# Patient Record
Sex: Female | Born: 1985 | Race: White | Hispanic: No | Smoking: Former smoker
Health system: Southern US, Community
[De-identification: ages and names within clinical notes are randomized; demographics above are authoritative.]

## PROBLEM LIST (undated history)

## (undated) DIAGNOSIS — I4891 Unspecified atrial fibrillation: Secondary | ICD-10-CM

## (undated) DIAGNOSIS — R51 Headache: Secondary | ICD-10-CM

## (undated) DIAGNOSIS — J45909 Unspecified asthma, uncomplicated: Secondary | ICD-10-CM

## (undated) DIAGNOSIS — F419 Anxiety disorder, unspecified: Secondary | ICD-10-CM

## (undated) DIAGNOSIS — M1990 Unspecified osteoarthritis, unspecified site: Secondary | ICD-10-CM

## (undated) DIAGNOSIS — K219 Gastro-esophageal reflux disease without esophagitis: Secondary | ICD-10-CM

## (undated) DIAGNOSIS — T7840XA Allergy, unspecified, initial encounter: Secondary | ICD-10-CM

## (undated) DIAGNOSIS — R519 Headache, unspecified: Secondary | ICD-10-CM

## (undated) DIAGNOSIS — E282 Polycystic ovarian syndrome: Secondary | ICD-10-CM

## (undated) HISTORY — DX: Allergy, unspecified, initial encounter: T78.40XA

## (undated) HISTORY — DX: Unspecified atrial fibrillation: I48.91

## (undated) HISTORY — PX: TONSILLECTOMY: SUR1361

## (undated) HISTORY — PX: OTHER SURGICAL HISTORY: SHX169

## (undated) HISTORY — PX: KNEE SURGERY: SHX244

## (undated) HISTORY — PX: TUBAL LIGATION: SHX77

## (undated) HISTORY — PX: ABDOMINAL HYSTERECTOMY: SHX81

---

## 2014-01-22 ENCOUNTER — Emergency Department (HOSPITAL_COMMUNITY): Payer: Medicaid Other

## 2014-01-22 ENCOUNTER — Emergency Department (HOSPITAL_COMMUNITY)
Admission: EM | Admit: 2014-01-22 | Discharge: 2014-01-23 | Disposition: A | Discharge status: Home / Self Care | Payer: Medicaid Other | Attending: Emergency Medicine | Admitting: Emergency Medicine

## 2014-01-22 ENCOUNTER — Encounter (HOSPITAL_COMMUNITY): Payer: Self-pay | Admitting: Emergency Medicine

## 2014-01-22 DIAGNOSIS — N83209 Unspecified ovarian cyst, unspecified side: Secondary | ICD-10-CM

## 2014-01-22 DIAGNOSIS — F17200 Nicotine dependence, unspecified, uncomplicated: Secondary | ICD-10-CM | POA: Insufficient documentation

## 2014-01-22 DIAGNOSIS — N12 Tubulo-interstitial nephritis, not specified as acute or chronic: Secondary | ICD-10-CM

## 2014-01-22 DIAGNOSIS — Z3202 Encounter for pregnancy test, result negative: Secondary | ICD-10-CM | POA: Insufficient documentation

## 2014-01-22 HISTORY — DX: Polycystic ovarian syndrome: E28.2

## 2014-01-22 LAB — COMPREHENSIVE METABOLIC PANEL
ALT: 16 U/L (ref 0–35)
AST: 22 U/L (ref 0–37)
Albumin: 4.4 g/dL (ref 3.5–5.2)
Alkaline Phosphatase: 45 U/L (ref 39–117)
BILIRUBIN TOTAL: 0.4 mg/dL (ref 0.3–1.2)
BUN: 7 mg/dL (ref 6–23)
CHLORIDE: 101 meq/L (ref 96–112)
CO2: 23 meq/L (ref 19–32)
Calcium: 10.2 mg/dL (ref 8.4–10.5)
Creatinine, Ser: 0.63 mg/dL (ref 0.50–1.10)
GFR calc Af Amer: >90 mL/min (ref >90)
GFR calc non Af Amer: >90 mL/min (ref >90)
Glucose, Bld: 75 mg/dL (ref 70–99)
POTASSIUM: 3.8 meq/L (ref 3.7–5.3)
SODIUM: 140 meq/L (ref 137–147)
Total Protein: 7.4 g/dL (ref 6.0–8.3)

## 2014-01-22 LAB — URINALYSIS, ROUTINE W REFLEX MICROSCOPIC
GLUCOSE, UA: NEGATIVE mg/dL
KETONES UR: 40 mg/dL — AB
Nitrite: NEGATIVE
PH: 5.5 (ref 5.0–8.0)
Protein, ur: 100 mg/dL — AB
Specific Gravity, Urine: 1.022 (ref 1.005–1.030)
Urobilinogen, UA: 1 mg/dL (ref 0.0–1.0)

## 2014-01-22 LAB — URINE MICROSCOPIC-ADD ON

## 2014-01-22 LAB — CBC WITH DIFFERENTIAL/PLATELET
HCT: 41.4 % (ref 36.0–46.0)
Hemoglobin: 14.3 g/dL (ref 12.0–15.0)
MCH: 31.6 pg (ref 26.0–34.0)
MCHC: 34.5 g/dL (ref 30.0–36.0)
MCV: 91.6 fL (ref 78.0–100.0)
PLATELETS: 186 10*3/uL (ref 150–400)
RBC: 4.52 MIL/uL (ref 3.87–5.11)
RDW: 12.8 % (ref 11.5–15.5)
WBC: 14.8 10*3/uL — ABNORMAL HIGH (ref 4.0–10.5)

## 2014-01-22 LAB — POC URINE PREG, ED: Preg Test, Ur: NEGATIVE

## 2014-01-22 LAB — LIPASE, BLOOD: Lipase: 30 U/L (ref 11–59)

## 2014-01-22 MED ORDER — IOHEXOL 300 MG/ML  SOLN
25.0000 mL | Freq: Once | INTRAMUSCULAR | Status: AC | PRN
  Administered 2014-01-22: 25 mL via ORAL

## 2014-01-22 MED ORDER — DEXTROSE 5 % IV SOLN
1.0000 g | Freq: Once | INTRAVENOUS | Status: AC
  Administered 2014-01-22: 1 g via INTRAVENOUS
  Filled 2014-01-22: qty 10

## 2014-01-22 MED ORDER — IOHEXOL 300 MG/ML  SOLN
100.0000 mL | Freq: Once | INTRAMUSCULAR | Status: AC | PRN
  Administered 2014-01-22: 100 mL via INTRAVENOUS

## 2014-01-22 MED ORDER — SODIUM CHLORIDE 0.9 % IV BOLUS (SEPSIS)
1000.0000 mL | Freq: Once | INTRAVENOUS | Status: AC
  Administered 2014-01-22: 1000 mL via INTRAVENOUS

## 2014-01-22 MED ORDER — ONDANSETRON HCL 4 MG/2ML IJ SOLN
4.0000 mg | Freq: Once | INTRAMUSCULAR | Status: AC
  Administered 2014-01-22: 4 mg via INTRAVENOUS
  Filled 2014-01-22: qty 2

## 2014-01-22 MED ORDER — MORPHINE SULFATE 4 MG/ML IJ SOLN
4.0000 mg | Freq: Once | INTRAMUSCULAR | Status: AC
  Administered 2014-01-22: 4 mg via INTRAVENOUS
  Filled 2014-01-22: qty 1

## 2014-01-22 NOTE — ED Provider Notes (Signed)
CSN: [id number]     Arrival date & time 01/22/14  1801 History   First MD Initiated Contact with Patient 01/22/14 2003     Chief Complaint  Patient presents with  . Abdominal Pain     (Consider location/radiation/quality/duration/timing/severity/associated sxs/prior Treatment) The history is provided by the patient. No language interpreter was used.  Katrina Jones is a 28 year old female with past history pursuance presenting to the ED with abdominal pain that has been ongoing since January 2015. Stated that the abdominal pain is localized to the lower abdomen, bilateral sides described as a sharp pain that is constant. Stated the pain stays in the abdomen but at times radiates up to the area described in mild chest pain with increased discomfort of the abdomen. Reported that she has been unable to keep anything down secondary to eating make the pain worse. Reported that she's been feeling nauseous and vomiting. Stated that she was [facility] and [facility] back in January 2015 regarding similar issue with the identified a lytic lesion in her abdomen. Reported that she's been having intermittent diarrhea. Denied dizziness, melena, hematochezia, fever, chills, hematuria, chest pain, shortness of breath, difficulty breathing. PCP none  Past Medical History  Diagnosis Date  . Polycystic ovarian syndrome    Past Surgical History  Procedure Laterality Date  . Knee surgery     History reviewed. No pertinent family history. History  Substance Use Topics  . Smoking status: Current Some Day Smoker -- 0.50 packs/day    Types: Cigarettes  . Smokeless tobacco: Never Used  . Alcohol Use: Yes     Comment: occasionally   OB History   Grav Para Term Preterm Abortions TAB SAB Ect Mult Living                 Review of Systems  Constitutional: Negative for fever and chills.  Respiratory: Negative for chest tightness and shortness of breath.   Cardiovascular: Negative for chest  pain.  Gastrointestinal: Positive for abdominal pain. Negative for nausea, vomiting, constipation, blood in stool and anal bleeding.  Musculoskeletal: Negative for back pain and neck pain.  Neurological: Negative for dizziness, weakness and numbness.  All other systems reviewed and are negative.     Allergies  Lactose intolerance (gi)  Home Medications   Current Outpatient Rx  Name  Route  Sig  Dispense  Refill  . ibuprofen (ADVIL,MOTRIN) 200 MG tablet   Oral   Take 400 mg by mouth every 6 (six) hours as needed for moderate pain.         . Multiple Vitamin (MULTIVITAMIN WITH MINERALS) TABS tablet   Oral   Take 1 tablet by mouth daily.          BP 119/72  Pulse 72  Temp(Src) 98 F (36.7 C) (Oral)  Resp 18  SpO2 98%  LMP 01/22/2014 Physical Exam  Nursing note and vitals reviewed. Constitutional: She is oriented to person, place, and time. She appears well-developed and well-nourished. No distress.  HENT:  Head: Normocephalic and atraumatic.  Mouth/Throat: Oropharynx is clear and moist. No oropharyngeal exudate.  Eyes: Conjunctivae and EOM are normal. Pupils are equal, round, and reactive to light. Right eye exhibits no discharge. Left eye exhibits no discharge.  Neck: Normal range of motion. Neck supple. No tracheal deviation present.  Cardiovascular: Normal rate, regular rhythm and normal heart sounds.  Exam reveals no friction rub.   No murmur heard. Pulses:      Radial pulses are 2+  on the right side, and 2+ on the left side.       Dorsalis pedis pulses are 2+ on the right side, and 2+ on the left side.  Pulmonary/Chest: Effort normal and breath sounds normal. No respiratory distress. She has no wheezes. She has no rales.  Abdominal: Soft. Bowel sounds are normal. There is tenderness in the right lower quadrant, suprapubic area and left lower quadrant. There is guarding. There is no tenderness at McBurney's point and negative Murphy's sign.  Diffuse tenderness upon  palpation to the abdomen most discomfort upon palpation to the lower quadrants - right more so than the left  Negative abdominal distention noted Negative peritoneal signs  Genitourinary: Vagina normal. No vaginal discharge found.  Pelvic Exam: Negative swelling, erythema, inflammation, lesions, sores noted to the external genitalia. Negative swelling, erythema, inflammation, lesions, sores, ulcers noted to the vaginal canal. Bright red blood in the vaginal vault - patient currently menstruating. Negative friability of the cervix. Negative CMT and bilateral adnexal tenderness.   Musculoskeletal: Normal range of motion.  Full ROM to upper and lower extremities without difficulty noted, negative ataxia noted.  Lymphadenopathy:    She has no cervical adenopathy.  Neurological: She is alert and oriented to person, place, and time. No cranial nerve deficit. She exhibits normal muscle tone. Coordination normal.  Skin: Skin is warm and dry. She is not diaphoretic. No erythema.  Psychiatric: She has a normal mood and affect. Her behavior is normal. Thought content normal.    ED Course  Procedures (including critical care time)  Received imaging and results from patient's visit to [facility] - patient seen and assessed in the ED setting with CT abdomen and pelvis with contrast performed on 10/27/2013. CT abdomen and pelvis with contrast at this time identified a small amount of minimally complex fluid within the dependent pelvis in addition to by 1 cm right adnexal cystic lesion-this constellation of findings is nonspecific although may represent a physiological rupture of an ovarian cyst. Hiatal hernia identified.     Results for orders placed during the hospital encounter of 01/22/14  WET PREP, GENITAL      Result Value Ref Range   Yeast Wet Prep HPF POC NONE SEEN  NONE SEEN   Trich, Wet Prep NONE SEEN  NONE SEEN   Clue Cells Wet Prep HPF POC RARE (*) NONE SEEN   WBC, Wet Prep HPF  POC RARE (*) NONE SEEN  CBC WITH DIFFERENTIAL      Result Value Ref Range   WBC 14.8 (*) 4.0 - 10.5 K/uL   RBC 4.52  3.87 - 5.11 MIL/uL   Hemoglobin 14.3  12.0 - 15.0 g/dL   HCT 16.1  09.6 - 04.5 %   MCV 91.6  78.0 - 100.0 fL   MCH 31.6  26.0 - 34.0 pg   MCHC 34.5  30.0 - 36.0 g/dL   RDW 40.9  81.1 - 91.4 %   Platelets 186  150 - 400 K/uL  COMPREHENSIVE METABOLIC PANEL      Result Value Ref Range   Sodium 140  137 - 147 mEq/L   Potassium 3.8  3.7 - 5.3 mEq/L   Chloride 101  96 - 112 mEq/L   CO2 23  19 - 32 mEq/L   Glucose, Bld 75  70 - 99 mg/dL   BUN 7  6 - 23 mg/dL   Creatinine, Ser 7.82  0.50 - 1.10 mg/dL   Calcium 95.6  8.4 -  10.5 mg/dL   Total Protein 7.4  6.0 - 8.3 g/dL   Albumin 4.4  3.5 - 5.2 g/dL   AST 22  0 - 37 U/L   ALT 16  0 - 35 U/L   Alkaline Phosphatase 45  39 - 117 U/L   Total Bilirubin 0.4  0.3 - 1.2 mg/dL   GFR calc non Af Amer >90  >90 mL/min   GFR calc Af Amer >90  >90 mL/min  URINALYSIS, ROUTINE W REFLEX MICROSCOPIC      Result Value Ref Range   Color, Urine RED (*) YELLOW   APPearance TURBID (*) CLEAR   Specific Gravity, Urine 1.022  1.005 - 1.030   pH 5.5  5.0 - 8.0   Glucose, UA NEGATIVE  NEGATIVE mg/dL   Hgb urine dipstick LARGE (*) NEGATIVE   Bilirubin Urine MODERATE (*) NEGATIVE   Ketones, ur 40 (*) NEGATIVE mg/dL   Protein, ur 409100 (*) NEGATIVE mg/dL   Urobilinogen, UA 1.0  0.0 - 1.0 mg/dL   Nitrite NEGATIVE  NEGATIVE   Leukocytes, UA MODERATE (*) NEGATIVE  URINE MICROSCOPIC-ADD ON      Result Value Ref Range   Squamous Epithelial / LPF FEW (*) RARE   WBC, UA TOO NUMEROUS TO COUNT  <3 WBC/hpf   RBC / HPF TOO NUMEROUS TO COUNT  <3 RBC/hpf   Bacteria, UA FEW (*) RARE  LIPASE, BLOOD      Result Value Ref Range   Lipase 30  11 - 59 U/L  POC URINE PREG, ED      Result Value Ref Range   Preg Test, Ur NEGATIVE  NEGATIVE   Labs Review Labs Reviewed  WET PREP, GENITAL - Abnormal; Notable for the following:    Clue Cells Wet Prep HPF POC  RARE (*)    WBC, Wet Prep HPF POC RARE (*)    All other components within normal limits  CBC WITH DIFFERENTIAL - Abnormal; Notable for the following:    WBC 14.8 (*)    All other components within normal limits  URINALYSIS, ROUTINE W REFLEX MICROSCOPIC - Abnormal; Notable for the following:    Color, Urine RED (*)    APPearance TURBID (*)    Hgb urine dipstick LARGE (*)    Bilirubin Urine MODERATE (*)    Ketones, ur 40 (*)    Protein, ur 100 (*)    Leukocytes, UA MODERATE (*)    All other components within normal limits  URINE MICROSCOPIC-ADD ON - Abnormal; Notable for the following:    Squamous Epithelial / LPF FEW (*)    Bacteria, UA FEW (*)    All other components within normal limits  GC/CHLAMYDIA PROBE AMP  URINE CULTURE  COMPREHENSIVE METABOLIC PANEL  LIPASE, BLOOD  POC URINE PREG, ED   Imaging Review Ct Abdomen Pelvis W Contrast  01/22/2014   CLINICAL DATA:  Lower abdominal pain extending to the back. History of polycystic ovarian disease.  EXAM: CT ABDOMEN AND PELVIS WITH CONTRAST  TECHNIQUE: Multidetector CT imaging of the abdomen and pelvis was performed using the standard protocol following bolus administration of intravenous contrast.  CONTRAST:  25mL OMNIPAQUE IOHEXOL 300 MG/ML SOLN, 100mL OMNIPAQUE IOHEXOL 300 MG/ML SOLN  COMPARISON:  None.  FINDINGS: Lung bases are clear. No pleural or pericardial fluid. The liver has normal appearance without focal lesions or biliary ductal dilatation. No calcified gallstones. The spleen is normal. The pancreas is normal. The adrenal glands are normal. The kidneys are normal. The  aorta and IVC are normal. No retroperitoneal mass or adenopathy.  The colon seems to be focal of fluid. This includes the rectum which is dilated and fluid-filled. The uterus is normal. There is a cyst associated with the right ovary measuring 3.4 cm in diameter. The left ovary appears grossly normal. I do not specifically identify the appendix.  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 This could be associated with back pain.  IMPRESSION: There appears to be fluid throughout the colon. No specific colon pathology is identified however.  3.4 cm cyst associated with the right ovary. In a person of this age, this is consistent with a functional cyst.  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 This could be associated with low back pain.   Electronically Signed   By: Paulina FusiMark  Shogry M.D.   On: 01/22/2014 22:54     EKG Interpretation None      MDM   Final diagnoses:  Pyelonephritis  Ovarian cyst   Medications  sodium chloride 0.9 % bolus 1,000 mL (0 mLs Intravenous Stopped 01/22/14 2340)  cefTRIAXone (ROCEPHIN) 1 g in dextrose 5 % 50 mL IVPB (0 g Intravenous Stopped 01/22/14 2339)  ondansetron (ZOFRAN) injection 4 mg (4 mg Intravenous Given 01/22/14 2212)  iohexol (OMNIPAQUE) 300 MG/ML solution 25 mL (25 mLs Oral Contrast Given 01/22/14 2151)  morphine 4 MG/ML injection 4 mg (4 mg Intravenous Given 01/22/14 2215)  iohexol (OMNIPAQUE) 300 MG/ML solution 100 mL (100 mLs Intravenous Contrast Given 01/22/14 2224)  morphine 4 MG/ML injection 4 mg (4 mg Intravenous Given 01/23/14 0005)  ondansetron (ZOFRAN) injection 4 mg (4 mg Intravenous Given 01/23/14 0004)   Filed Vitals:   01/22/14 1815 01/22/14 2234  BP: 115/80 119/72  Pulse: 78 72  Temp: 99.6 F (37.6 C) 98 F (36.7 C)  TempSrc: Oral Oral  Resp: 16 18  SpO2: 97% 98%    Patient presenting to the ED with abdominal pain been ongoing since January 2015 localized to bilateral lower abdominal region described as a sharp radiation to the chest with episodes of intense pain. Reported that she's been unable to keep any food or fluids down since January secondary to every time she feels pain worsens and she feels nauseous with episodes of emesis. Reported intermittent episodes diarrhea. Patient reported that [facility] with  CT scan of the abdomen was performed with findings of lytic lesions. CT scan obtained from [facility] with negative findings performed in January 2015.  Alert and oriented. GCS 15. Heart rate and rhythm normal. Lungs clear to auscultation to upper and lower lobes bilaterally. Bowel sounds normoactive in all 4 quadrants of the most discomfort upon palpation to the bilateral lower quadrants and suprapubic region - right more so than the left. Soft upon palpation. Negative McBurney's point. Negative Murphy's sign. Negative peritoneal signs. CBC noted mild elevated white blood, 14.8. CMP negative findings-kidneys and liver functioning well. Urinalysis noted large hemoglobin-patient currently menstruating. A moderate amount of leukocytes with many white blood cells-too numerous to count identified. Urine pregnancy negative. Wet prep negative for clue cells and white blood cell count-negative Trichomonas or yeast noted. CT abdomen and pelvis with contrast identified fluid throughout the colon, no specific pathology is identified. 3.4 cm cyst associated with the right ovary person disease this appears to be a functional cyst. Urine culture pending.  This provider discussed US to rule out gallbladder disease - patient reported  that she does not want to stay to get the imaging. Discussed dangers of gallbladder disease and fatality - patient understood and continued to decline the exam. Discussed with Dr. Wilkie Aye case - labs reviewed and liver enzymes within normal limits, no sign of gallstones noted on CT scan, as per physician reported okay for home. Doubt appendicitis. Doubt cholecystitis/cholangitis. Doubt pancreatitis. Negative trichomoniasis-doubt TOA. Doubt ectopic pregnancy. Negative acute abdominal processes. Negative hydronephrosis. Patient started on IV antibiotics and IV fluids given for urine infection-pyelonephritis. Patient's CT abdomen and pelvis from January 2015 reviewed, appears the the cyst on the right ovary  has increased from 3.1 to 3.4 - appears to be a functional cyst. Discussed with patient the increased in size of the ovarian cyst. Patient tolerated fluid PO without difficulty. Patient stable, afebrile. Discharged patient. Discharged patient with antibiotics and pain medications. Discussed with patient to rest and stay hydrated. Discussed with patient and educated on signs and symptoms of gallbladder disease. Referred [facility]. Discussed with patient the importance of following up and possible biopsy of the ovarian cyst. Discussed with patient to closely monitor symptoms and if symptoms are to worsen or change to report back to the ED - strict return instructions given.  Patient agreed to plan of care, understood, all questions answered.   Raymon Mutton, PA-C 01/23/14 1245  [address] PA-C 01/23/14 1246  Raymon Mutton, PA-C 01/23/14 1249  Raymon Mutton, PA-C 01/23/14 1255  12:56 PM This provider spoke with the patient - verified by DOB and name. Patient reported that she is still in pain. Stated that she was able to tolerate fluids yesterday, but not able to tolerate anything PO today. Patient reported that most of her discomfort is in the RUQ. This provider recommended patient to get [place] to rule out gallbladder issue, as per request last night that patient declined due to her ride being here. Discussed importance and dangers - patient understood. Patient understood and reported that she will come back to the ED later on this afternoon.   Raymon Mutton, PA-C 01/23/14 1302

## 2014-01-22 NOTE — ED Notes (Signed)
Patient states she has lower abdominal pain that radiates into her lower back. Patient states she was diagnosed with an cyst in January 2015. Patient states that she has not been able to eat or drink for the past month.

## 2014-01-22 NOTE — ED Notes (Signed)
POCT PREG resulted NEG. 

## 2014-01-23 LAB — WET PREP, GENITAL
Trich, Wet Prep: NONE SEEN
YEAST WET PREP: NONE SEEN

## 2014-01-23 LAB — GC/CHLAMYDIA PROBE AMP
CT Probe RNA: NEGATIVE
GC Probe RNA: NEGATIVE

## 2014-01-23 LAB — URINE CULTURE
Colony Count: >=100000
Special Requests: NORMAL

## 2014-01-23 MED ORDER — ONDANSETRON HCL 4 MG/2ML IJ SOLN
4.0000 mg | Freq: Once | INTRAMUSCULAR | Status: AC
  Administered 2014-01-23: 4 mg via INTRAVENOUS
  Filled 2014-01-23: qty 2

## 2014-01-23 MED ORDER — CIPROFLOXACIN HCL 500 MG PO TABS: 500.0000 mg | ORAL_TABLET | Freq: Two times a day (BID) | ORAL | Status: DC

## 2014-01-23 MED ORDER — MORPHINE SULFATE 4 MG/ML IJ SOLN
4.0000 mg | Freq: Once | INTRAMUSCULAR | Status: AC
  Administered 2014-01-23: 4 mg via INTRAVENOUS
  Filled 2014-01-23: qty 1

## 2014-01-23 MED ORDER — NAPROXEN 375 MG PO TABS: 375.0000 mg | ORAL_TABLET | Freq: Two times a day (BID) | ORAL | Status: DC

## 2014-01-23 MED ORDER — ONDANSETRON HCL 4 MG PO TABS: 4.0000 mg | ORAL_TABLET | Freq: Four times a day (QID) | ORAL | Status: DC

## 2014-01-23 NOTE — Discharge Instructions (Signed)
Please call your doctor for a followup appointment within 24-48 hours. When you talk to your doctor please let them know that you were seen in the emergency department and have them acquire all of your records so that they can discuss the findings with you and formulate a treatment plan to fully care for your new and ongoing problems. Please call and set-up an appointment with health and wellness center Please call and set-up an appointment with OBGYN will most likely need a repeat ultrasound and biopsy to be performed on the right ovarian cyst Please rest and stay hydrated Please take antibiotics as prescribed Please continue to monitor symptoms and if symptoms are to worsen or change (fever greater than 101, chills, sweating, nausea, vomiting, diarrhea, abdominal pain, chest pain, shortness of breath, difficulty breathing, weakness, fatigue, inability to keep food or fluids down, back pain, neck pain, neck stiffness, syncope) please report back to the ED immediately    Pyelonephritis, Adult Pyelonephritis is a kidney infection. In general, there are 2 main types of pyelonephritis:  Infections that come on quickly without any warning (acute pyelonephritis).  Infections that persist for a long period of time (chronic pyelonephritis). CAUSES  Two main causes of pyelonephritis are:  Bacteria traveling from the bladder to the kidney. This is a problem especially in pregnant women. The urine in the bladder can become filled with bacteria from multiple causes, including:  Inflammation of the prostate gland (prostatitis).  Sexual intercourse in females.  Bladder infection (cystitis).  Bacteria traveling from the bloodstream to the tissue part of the kidney. Problems that may increase your risk of getting a kidney infection include:  Diabetes.  Kidney stones or bladder stones.  Cancer.  Catheters placed in the bladder.  Other abnormalities of the kidney or ureter. SYMPTOMS   Abdominal  pain.  Pain in the side or flank area.  Fever.  Chills.  Upset stomach.  Blood in the urine (dark urine).  Frequent urination.  Strong or persistent urge to urinate.  Burning or stinging when urinating. DIAGNOSIS  Your caregiver may diagnose your kidney infection based on your symptoms. A urine sample may also be taken. TREATMENT  In general, treatment depends on how severe the infection is.   If the infection is mild and caught early, your caregiver may treat you with oral antibiotics and send you home.  If the infection is more severe, the bacteria may have gotten into the bloodstream. This will require intravenous (IV) antibiotics and a hospital stay. Symptoms may include:  High fever.  Severe flank pain.  Shaking chills.  Even after a hospital stay, your caregiver may require you to be on oral antibiotics for a period of time.  Other treatments may be required depending upon the cause of the infection. HOME CARE INSTRUCTIONS   Take your antibiotics as directed. Finish them even if you start to feel better.  Make an appointment to have your urine checked to make sure the infection is gone.  Drink enough fluids to keep your urine clear or pale yellow.  Take medicines for the bladder if you have urgency and frequency of urination as directed by your caregiver. SEEK IMMEDIATE MEDICAL CARE IF:   You have a fever or persistent symptoms for more than 2-3 days.  You have a fever and your symptoms suddenly get worse.  You are unable to take your antibiotics or fluids.  You develop shaking chills.  You experience extreme weakness or fainting.  There is no improvement after  2 days of treatment. MAKE SURE YOU:  Understand these instructions.  Will watch your condition.  Will get help right away if you are not doing well or get worse. Document Released: 09/28/2005 Document Revised: 03/29/2012 Document Reviewed: 03/04/2011 [facility] Patient Information 2014  [place], [place].  Ovarian Cyst An ovarian cyst is a sac filled with fluid or blood. This sac is attached to the ovary. Some cysts go away on their own. Other cysts need treatment.  HOME CARE   Only take medicine as told by your doctor.  Follow up with your doctor as told.  Get regular pelvic exams and Pap tests. GET HELP IF:  Your periods are late, not regular, or painful.  You stop having periods.  Your belly (abdominal) or pelvic pain does not go away.  Your belly becomes large or puffy (swollen).  You have a hard time peeing (totally emptying your bladder).  You have pressure on your bladder.  You have pain during sex.  You feel fullness, pressure, or discomfort in your belly.  You lose weight for no reason.  You feel sick most of the time.  You have a hard time pooping (constipation).  You do not feel like eating.  You develop pimples (acne).  You have an increase in hair on your body and face.  You are gaining weight for no reason.  You think you are pregnant. GET HELP RIGHT AWAY IF:   Your belly pain gets worse.  You feel sick to your stomach (nauseous), and you throw up (vomit).  You have a fever that comes on fast.  You have belly pain while pooping (bowel movement).  Your periods are heavier than usual. MAKE SURE YOU:   Understand these instructions.  Will watch your condition.  Will get help right away if you are not doing well or get worse. Document Released: 03/16/2008 Document Revised: 07/19/2013 Document Reviewed: 06/05/2013 [facility] Patient Information 2014 [place], [place].   Emergency Department Resource Guide 1) Find a Doctor and Pay Out of Pocket Although you won't have to find out who is covered by your insurance plan, it is a good idea to ask around and get recommendations. You will then need to call the office and see if the doctor you have chosen will accept you as a new patient and what types of options they offer for patients  who are self-pay. Some doctors offer discounts or will set up payment plans for their patients who do not have insurance, but you will need to ask so you aren't surprised when you get to your appointment.  2) Contact Your Local Health Department Not all health departments have doctors that can see patients for sick visits, but many do, so it is worth a call to see if yours does. If you don't know where your local health department is, you can check in your phone book. The CDC also has a tool to help you locate your state's health department, and many state websites also have listings of all of their local health departments.  3) Find a Walk-in Clinic If your illness is not likely to be very severe or complicated, you may want to try a walk in clinic. These are popping up all over the country in pharmacies, drugstores, and shopping centers. They're usually staffed by nurse practitioners or physician assistants that have been trained to treat common illnesses and complaints. They're usually fairly quick and inexpensive. However, if you have serious medical issues or chronic medical problems, these are  probably not your best option.  No Primary Care Doctor: - Call Health Connect at  [phone] - they can help you locate a primary care doctor that  accepts your insurance, provides certain services, etc. - Physician Referral Service- [phone]  Chronic Pain Problems: Organization         Address  Phone   Notes  [facility]  [phone] Patients need to be referred by their primary care doctor.   Medication Assistance: Organization         Address  Phone   Notes  [facility]ication [facility] [facility] [address] [place], [place] [address] [phone]) [phone] --Must be a resident of [facility] [place] -- Must have NO insurance coverage whatsoever (no Medicaid/ Medicare, etc.) -- The pt. MUST have a primary care doctor that directs their care regularly and follows  them in the community   MedAssist  [phone]   [employer]  [phone]    Agencies that provide inexpensive medical care: Organization         Address  Phone   Notes  [facility]  [phone]   [facility]    [phone]   [facility] [address] [place], [place] [address] [phone]   [facility] [address], [place] [phone]) [phone]   [facility]anned Parenthood    [phone]) [phone]   [facility]    [phone]   [facility]  [address], [place] Phone:  [phone], Fax:  [phone] Hours of Operation:  9 am - 6 pm, M-F.  Also accepts Medicaid/Medicare and self-pay.  [facility]  [address], [place] Phone: [phone], Fax: [phone]. Hours of Operation:  8:30 am - 5:30 pm, M-F.  Also accepts Medicaid and self-pay.  [facility] [place] [address], [place] Phone: [phone]) [phone]   [facility] [address], [place], [place] [phone], Ext. [phone] Mondays & Thursdays: 7-9 AM.  First 15 patients are seen on a first come, first serve basis.    Medicaid-accepting [facility] Providers:  Organization         Address  Phone   Notes  [facility] [address], [place] [phone] Also accepts self-pay patients.  [facility] [address] [place], [place]  [phone]   [facility] [address], [place] [phone]   [facility] [address] [place] [address], [place] [phone]   Renaye RakersVeita Bland [address], [place]   [phone] Only accepts [place] Access [place] patients after they have their name applied to their card.   Self-Pay (no insurance) in [facility] [place]:  Organization         Address  Phone   Notes  Sickle Cell  Patients, [facility] [address], [place] [phone]   [facility] [address], [place] [phone]   [facility] [place]  [address], [place] [phone]   [facility]/Dr. Osei-Bonsu  [address] [place] [address], [place] or [phone], [place] [phone] Phone number for both [place] and [place] locations is the same.  [facility]  [address] [place] [phone]   [facility] [address], [place] or [address] [phone]   [facility] [address], [place] [phone], phone; [phone], fax Sees patients 1st and 3rd Saturday of every month.  Must not qualify for public or private insurance (i.e. Medicaid, Medicare, [place] Health Choice, Veterans' Benefits)  Household income should be no more than 200% of the poverty level The clinic cannot treat you if you are pregnant or think you are pregnant  Sexually transmitted diseases are not treated at the clinic.    Dental Care: Organization         Address  Phone  Notes  [facility] Department of [facility] [address] [place], [place] [phone] Accepts children up to age 67 who are enrolled in [place] or [place] Health Choice; pregnant women with a Medicaid card; and children who have applied for Medicaid or [place] Health Choice, but were declined, whose parents can pay a reduced fee at time of service.  [facility] Department of [facility]  [address], [place] [phone] Accepts children up to age 35 who are enrolled in [place] or [place] Health Choice; pregnant women with a Medicaid card; and children who have applied for Medicaid or  Health Choice, but were declined, whose parents can pay a reduced fee at time of service.  [facility]  [address] [place], [place] [phone] Patients are seen by appointment only. Walk-ins are not accepted. [facility] will see patients 25 years of age and older. Monday - Tuesday (8am-5pm) Most Wednesdays (8:30-5pm) $30 per visit, cash only  [facility] Adult [facility] Access PRO[facility]  [address], [facility] [phone] Patients are seen by appointment only. Walk-ins are not accepted. [facility] will see patients 74 years of age and older. One Wednesday Evening (Monthly: Volunteer Based).  $30 per visit, cash only  [employer]  [phone] for adults; Children under age 79, call Graduate Pediatric Dentistry at [phone]. Children aged 21-14, please call [phone] to request a pediatric application.  Dental services are provided in all areas of dental care including fillings, crowns and bridges, complete and partial dentures, implants, gum treatment, root canals, and extractions. Preventive care is also provided. Treatment is provided to both adults and children. Patients are selected via a lottery and there is often a waiting list.   [facility] [address], [place]  [phone] [email or web address]   [facility] [address] [place], [place] [phone], Ext. [phone] Second and Fourth Thursday of each month, opens at 6:30 AM; Clinic ends at 9 AM.  Patients are seen on a first-come first-served basis, and a limited number are seen during each clinic.   [facility]  [address] [place], [place] [phone]   Eligibility Requirements You must have lived in [place], [place], or [place] counties for at least the last three months.   You cannot be eligible for state or federal sponsored [place], including [employer], [place], or [employer].   You generally cannot be eligible for healthcare insurance through your employer.    How to apply: Eligibility  screenings are held every Tuesday and Wednesday afternoon from 1:00 pm until 4:00 pm. You do not need an appointment for the interview!  [facility]  [address] [place], [place] [phone]   [facility] Health Department  [phone]   [facility] Health Department  [phone]   [facility] Health Department  [phone]    Behavioral Health Resources in the Community: Intensive Outpatient Programs Organization         Address  Phone  Notes  [facility] [address] [place], [place] [phone]   [facility] Outpatient [address] [place], [place] [phone]   [facility] [address] [place], [place]  [phone]   [facility] Mental Health [address]  [place], [place] [phone] or [phone]   Substance Abuse Resources Organization         Address  Phone  Notes  Alcohol [facility] Drug Services  [phone]   [facility]  [phone]   [facility] [place]  [phone]   Floydene Flock  [phone]   Residential & Outpatient Substance Abuse Program  [phone]   Psychological Services Organization         Address  Phone  Notes  [facility]  [phone]   [facility]  [phone]   [facility] [address] [place] [phone] or [phone]    Mobile Crisis Teams Organization         Address  Phone  Notes  [facility], Mobile Crisis Care Unit  [phone]   [facility]  [address] [place], [place] [phone]   Doristine Locks [address] [place] [phone]    Self-Help/Support Groups Organization         Address  Phone             Notes  [facility] [place] - variety of support groups  [phone]- I7437963 Call for more information  Narcotics Anonymous (NA), [facility] [address], [employer] [place]  2 meetings at  this location   Statistician         Address  Phone  Notes  [facility] [address],    [place]  [phone]   [facility]  [address], [place] [address], [place], [place] [phone]   [facility] Treatment [facility] [address] [place], [place] [phone] Admissions: 8am-3pm M-F  [facility] [address],    [place], [place] [phone]   [facility] [address] [place], [place], [place] [phone]   [facility] [address],  [place], [place] [phone]   [facility] - Intensive Outpatient [address], [place], [place] [phone]   [facility] [address]  [place], [place] [phone] or [phone]   [facility] ([facility]) [address], [place], [place] [phone] Accepts Medicaid  [facility] [address],  [place] [phone] Substance Abuse/Addiction Treatment   [facility] Organization         Address  Phone  Notes  [facility]  [phone]   Angie Fava, PhD [address] [place], [place]   7402989032 or [phone]   [facility]al   [address] [place], [place] [phone]   [facility] [address] [place], [place] [phone] Insurance/Medicaid/sponsorship through [employer] and Families [address]  [place], [place] [phone] [place], [place] [phone]    Dr. Adele Schilder  [phone]   [facility] Dept. 1) [address] [place] 2) [place] 3)  [place] [address], [place] [phone]  [phone]   [place] [phone] or [phone] (After Hours)

## 2014-01-23 NOTE — ED Provider Notes (Signed)
Medical screening examination/treatment/procedure(s) were performed by non-physician practitioner and as supervising physician I was immediately available for consultation/collaboration.   EKG Interpretation None        Gwyneth SproutWhitney Tymar Polyak, MD 01/23/14 2358

## 2014-01-25 ENCOUNTER — Encounter (HOSPITAL_COMMUNITY): Payer: Self-pay | Admitting: Emergency Medicine

## 2014-01-25 ENCOUNTER — Emergency Department (HOSPITAL_COMMUNITY)
Admission: EM | Admit: 2014-01-25 | Discharge: 2014-01-25 | Disposition: A | Discharge status: Home / Self Care | Payer: Medicaid Other | Attending: Emergency Medicine | Admitting: Emergency Medicine

## 2014-01-25 DIAGNOSIS — Z8742 Personal history of other diseases of the female genital tract: Secondary | ICD-10-CM | POA: Insufficient documentation

## 2014-01-25 DIAGNOSIS — R1011 Right upper quadrant pain: Secondary | ICD-10-CM | POA: Insufficient documentation

## 2014-01-25 DIAGNOSIS — R509 Fever, unspecified: Secondary | ICD-10-CM | POA: Insufficient documentation

## 2014-01-25 DIAGNOSIS — R112 Nausea with vomiting, unspecified: Secondary | ICD-10-CM | POA: Insufficient documentation

## 2014-01-25 DIAGNOSIS — F17200 Nicotine dependence, unspecified, uncomplicated: Secondary | ICD-10-CM | POA: Insufficient documentation

## 2014-01-25 DIAGNOSIS — Z87440 Personal history of urinary (tract) infections: Secondary | ICD-10-CM | POA: Insufficient documentation

## 2014-01-25 DIAGNOSIS — Z792 Long term (current) use of antibiotics: Secondary | ICD-10-CM | POA: Insufficient documentation

## 2014-01-25 DIAGNOSIS — Z791 Long term (current) use of non-steroidal anti-inflammatories (NSAID): Secondary | ICD-10-CM | POA: Insufficient documentation

## 2014-01-25 DIAGNOSIS — R638 Other symptoms and signs concerning food and fluid intake: Secondary | ICD-10-CM | POA: Insufficient documentation

## 2014-01-25 LAB — CBC WITH DIFFERENTIAL/PLATELET
Basophils Absolute: 0 10*3/uL (ref 0.0–0.1)
Basophils Relative: 0 % (ref 0–1)
EOS PCT: 2 % (ref 0–5)
Eosinophils Absolute: 0.2 10*3/uL (ref 0.0–0.7)
HEMATOCRIT: 44.9 % (ref 36.0–46.0)
HEMOGLOBIN: 15.2 g/dL — AB (ref 12.0–15.0)
LYMPHS ABS: 1.7 10*3/uL (ref 0.7–4.0)
LYMPHS PCT: 24 % (ref 12–46)
MCH: 31.3 pg (ref 26.0–34.0)
MCHC: 33.9 g/dL (ref 30.0–36.0)
MCV: 92.6 fL (ref 78.0–100.0)
MONO ABS: 0.5 10*3/uL (ref 0.1–1.0)
MONOS PCT: 7 % (ref 3–12)
NEUTROS ABS: 4.6 10*3/uL (ref 1.7–7.7)
Neutrophils Relative %: 66 % (ref 43–77)
Platelets: 194 10*3/uL (ref 150–400)
RBC: 4.85 MIL/uL (ref 3.87–5.11)
RDW: 13.1 % (ref 11.5–15.5)
WBC: 7.1 10*3/uL (ref 4.0–10.5)

## 2014-01-25 LAB — COMPREHENSIVE METABOLIC PANEL
ALT: 12 U/L (ref 0–35)
AST: 17 U/L (ref 0–37)
Albumin: 4.1 g/dL (ref 3.5–5.2)
Alkaline Phosphatase: 40 U/L (ref 39–117)
BUN: 6 mg/dL (ref 6–23)
CALCIUM: 9.2 mg/dL (ref 8.4–10.5)
CO2: 26 mEq/L (ref 19–32)
Chloride: 107 mEq/L (ref 96–112)
Creatinine, Ser: 0.7 mg/dL (ref 0.50–1.10)
GFR calc non Af Amer: >90 mL/min (ref >90)
GLUCOSE: 91 mg/dL (ref 70–99)
POTASSIUM: 4.4 meq/L (ref 3.7–5.3)
Sodium: 142 mEq/L (ref 137–147)
TOTAL PROTEIN: 6.3 g/dL (ref 6.0–8.3)
Total Bilirubin: <0.2 mg/dL — ABNORMAL LOW (ref 0.3–1.2)

## 2014-01-25 LAB — LIPASE, BLOOD: Lipase: 75 U/L — ABNORMAL HIGH (ref 11–59)

## 2014-01-25 MED ORDER — FENTANYL CITRATE 0.05 MG/ML IJ SOLN
50.0000 ug | Freq: Once | INTRAMUSCULAR | Status: AC
  Administered 2014-01-25: 50 ug via INTRAVENOUS
  Filled 2014-01-25: qty 2

## 2014-01-25 MED ORDER — SODIUM CHLORIDE 0.9 % IV BOLUS (SEPSIS)
1000.0000 mL | Freq: Once | INTRAVENOUS | Status: AC
  Administered 2014-01-25: 1000 mL via INTRAVENOUS

## 2014-01-25 MED ORDER — ONDANSETRON 4 MG PO TBDP: ORAL_TABLET | ORAL | Status: DC

## 2014-01-25 MED ORDER — DEXTROSE 5 % IV SOLN
1.0000 g | Freq: Once | INTRAVENOUS | Status: DC
  Filled 2014-01-25: qty 10

## 2014-01-25 NOTE — Discharge Instructions (Signed)
If you were given medicines take as directed.  If you are on coumadin or contraceptives realize their levels and effectiveness is altered by many different medicines.  If you have any reaction (rash, tongues swelling, other) to the medicines stop taking and see a physician.   Please follow up as directed and return to the ER or see a physician for new or worsening symptoms.  Thank you. Filed Vitals:   01/25/14 0902 01/25/14 1059  BP: 127/64 103/61  Pulse: 74 84  Temp: 98 F (36.7 C)   TempSrc: Oral   Resp: 20 18  SpO2: 100% 97%    Biliary Colic  Biliary colic is a steady or irregular pain in the upper abdomen. It is usually under the right side of the rib cage. It happens when gallstones interfere with the normal flow of bile from the gallbladder. Bile is a liquid that helps to digest fats. Bile is made in the liver and stored in the gallbladder. When you eat a meal, bile passes from the gallbladder through the cystic duct and the common bile duct into the small intestine. There, it mixes with partially digested food. If a gallstone blocks either of these ducts, the normal flow of bile is blocked. The muscle cells in the bile duct contract forcefully to try to move the stone. This causes the pain of biliary colic.  SYMPTOMS   A person with biliary colic usually complains of pain in the upper abdomen. This pain can be:  In the center of the upper abdomen just below the breastbone.  In the upper-right part of the abdomen, near the gallbladder and liver.  Spread back toward the right shoulder blade.  Nausea and vomiting.  The pain usually occurs after eating.  Biliary colic is usually triggered by the digestive system's demand for bile. The demand for bile is high after fatty meals. Symptoms can also occur when a person who has been fasting suddenly eats a very large meal. Most episodes of biliary colic pass after 1 to 5 hours. After the most intense pain passes, your abdomen may continue  to ache mildly for about 24 hours. DIAGNOSIS  After you describe your symptoms, your caregiver will perform a physical exam. He or she will pay attention to the upper right portion of your belly (abdomen). This is the area of your liver and gallbladder. An ultrasound will help your caregiver look for gallstones. Specialized scans of the gallbladder may also be done. Blood tests may be done, especially if you have fever or if your pain persists. PREVENTION  Biliary colic can be prevented by controlling the risk factors for gallstones. Some of these risk factors, such as heredity, increasing age, and pregnancy are a normal part of life. Obesity and a high-fat diet are risk factors you can change through a healthy lifestyle. Women going through menopause who take hormone replacement therapy (estrogen) are also more likely to develop biliary colic. TREATMENT   Pain medication may be prescribed.  You may be encouraged to eat a fat-free diet.  If the first episode of biliary colic is severe, or episodes of colic keep retuning, surgery to remove the gallbladder (cholecystectomy) is usually recommended. This procedure can be done through small incisions using an instrument called a laparoscope. The procedure often requires a brief stay in the hospital. Some people can leave the hospital the same day. It is the most widely used treatment in people troubled by painful gallstones. It is effective and safe, with no  complications in more than 90% of cases.  If surgery cannot be done, medication that dissolves gallstones may be used. This medication is expensive and can take months or years to work. Only small stones will dissolve.  Rarely, medication to dissolve gallstones is combined with a procedure called shock-wave lithotripsy. This procedure uses carefully aimed shock waves to break up gallstones. In many people treated with this procedure, gallstones form again within a few years. PROGNOSIS  If gallstones  block your cystic duct or common bile duct, you are at risk for repeated episodes of biliary colic. There is also a 25% chance that you will develop a gallbladder infection(acute cholecystitis), or some other complication of gallstones within 10 to 20 years. If you have surgery, schedule it at a time that is convenient for you and at a time when you are not sick. HOME CARE INSTRUCTIONS   Drink plenty of clear fluids.  Avoid fatty, greasy or fried foods, or any foods that make your pain worse.  Take medications as directed. SEEK MEDICAL CARE IF:   You develop a fever over 100.5 F (38.1 C).  Your pain gets worse over time.  You develop nausea that prevents you from eating and drinking.  You develop vomiting. SEEK IMMEDIATE MEDICAL CARE IF:   You have continuous or severe belly (abdominal) pain which is not relieved with medications.  You develop nausea and vomiting which is not relieved with medications.  You have symptoms of biliary colic and you suddenly develop a fever and shaking chills. This may signal cholecystitis. Call your caregiver immediately.  You develop a yellow color to your skin or the white part of your eyes (jaundice). Document Released: 03/01/2006 Document Revised: 12/21/2011 Document Reviewed: 05/10/2008 [facility] Patient Information 2014 [employer], [place].

## 2014-01-25 NOTE — ED Notes (Addendum)
Pt c/o right side abd pain. Pt states its been going on since Jan. 15. Was seen two days ago but couldn't stay and get ultrasound. Wants ultraound today. Pt in NAD on cell phone in triage.

## 2014-01-25 NOTE — ED Notes (Addendum)
Patient states she needs to leave ED, left prior to providing urine specimen and prior to receiving rocephin. Patient spoke with doctor Jodi MourningZavitz about not receiving rocephin, Jodi MourningZavitz, MD proceeded to discharge patient.

## 2014-01-25 NOTE — ED Provider Notes (Addendum)
CSN: [id number]     Arrival date & time 01/25/14  [id number] History   First MD Initiated Contact with Patient 01/25/14 0913     Chief Complaint  Patient presents with  . Abdominal Pain    right     (Consider location/radiation/quality/duration/timing/severity/associated sxs/prior Treatment) HPI Comments: 28 year old female with PCO S., UTI history presents with recurrent right upper quadrant pain with nausea and vomiting for the past month. Patient was recently seen and diagnosed with UTI and has been taking Cipro for the past 2 days. Subjective fevers intermittently. No known gallstone or kidney stone history. Pain is worse after eating food especially fatty foods. No abdominal surgery history.  Patient is a 28 y.o. female presenting with abdominal pain. The history is provided by the patient.  Abdominal Pain Associated symptoms: fever, nausea and vomiting   Associated symptoms: no chest pain, no chills, no dysuria, no shortness of breath and no vaginal bleeding     Past Medical History  Diagnosis Date  . Polycystic ovarian syndrome    Past Surgical History  Procedure Laterality Date  . Knee surgery     No family history on file. History  Substance Use Topics  . Smoking status: Current Some Day Smoker -- 0.50 packs/day    Types: Cigarettes  . Smokeless tobacco: Never Used  . Alcohol Use: Yes     Comment: occasionally   OB History   Grav Para Term Preterm Abortions TAB SAB Ect Mult Living                 Review of Systems  Constitutional: Positive for fever and appetite change. Negative for chills.  HENT: Negative for congestion.   Eyes: Negative for visual disturbance.  Respiratory: Negative for shortness of breath.   Cardiovascular: Negative for chest pain.  Gastrointestinal: Positive for nausea, vomiting and abdominal pain.  Genitourinary: Negative for dysuria, flank pain and vaginal bleeding.  Musculoskeletal: Negative for back pain, neck pain and neck stiffness.   Skin: Negative for rash.  Neurological: Negative for light-headedness and headaches.      Allergies  Lactose intolerance (gi)  Home Medications   Prior to Admission medications   Medication Sig Start Date End Date Taking? Authorizing Provider  ciprofloxacin (CIPRO) 500 MG tablet Take 1 tablet (500 mg total) by mouth 2 (two) times daily. 01/23/14  Yes Marissa Sciacca, PA-C  Multiple Vitamin (MULTIVITAMIN WITH MINERALS) TABS tablet Take 1 tablet by mouth daily.   Yes Historical Provider, MD  naproxen (NAPROSYN) 375 MG tablet Take 1 tablet (375 mg total) by mouth 2 (two) times daily. 01/23/14  Yes Marissa Sciacca, PA-C  ondansetron (ZOFRAN) 4 MG tablet Take 1 tablet (4 mg total) by mouth every 6 (six) hours. 01/23/14  Yes Marissa Sciacca, PA-C   BP 127/64  Pulse 74  Temp(Src) 98 F (36.7 C) (Oral)  Resp 20  SpO2 100%  LMP 01/22/2014 Physical Exam  Nursing note and vitals reviewed. Constitutional: She is oriented to person, place, and time. She appears well-developed and well-nourished.  HENT:  Head: Normocephalic and atraumatic.  Eyes: Conjunctivae are normal. Right eye exhibits no discharge. Left eye exhibits no discharge.  Neck: Normal range of motion. Neck supple. No tracheal deviation present.  Cardiovascular: Normal rate and regular rhythm.   Pulmonary/Chest: Effort normal and breath sounds normal.  Abdominal: Soft. She exhibits no distension. There is tenderness (RUQ). There is no guarding.  Musculoskeletal: She exhibits no edema.  Neurological: She is alert and oriented to person, place,  and time.  Skin: Skin is warm. No rash noted.  Psychiatric: She has a normal mood and affect.    ED Course  Procedures (including critical care time)  EMERGENCY DEPARTMENT BILIARY ULTRASOUND INTERPRETATION "Study: Limited Abdominal Ultrasound of the gallbladder and common bile duct."  INDICATIONS: Abdominal pain, RUQ pain and Nausea Indication: Multiple views of the gallbladder and  common bile duct were obtained in real-time with a Multi-frequency probe." PERFORMED BY:  Myself IMAGES ARCHIVED?: Yes FINDINGS: Gallstones absent, Gallbladder wall normal in thickness, Sonographic Murphy's sign absent and Common bile duct normal in size LIMITATIONS: Bowel Gas INTERPRETATION: Normal  Emergency Focused Ultrasound Exam Limited retroperitoneal ultrasound of kidneys  Performed and interpreted by Dr. Jodi MourningZavitz Indication: flank pain Focused abdominal ultrasound with both kidneys imaged in transverse and longitudinal planes in real-time. Interpretation: No hydronephrosis visualized.   Images archived electronically   Labs Review Labs Reviewed  COMPREHENSIVE METABOLIC PANEL - Abnormal; Notable for the following:    Total Bilirubin <0.2 (*)    All other components within normal limits  CBC WITH DIFFERENTIAL - Abnormal; Notable for the following:    Hemoglobin 15.2 (*)    All other components within normal limits  LIPASE, BLOOD - Abnormal; Notable for the following:    Lipase 75 (*)    All other components within normal limits  URINE CULTURE    Imaging Review No results found.   EKG Interpretation None      MDM   Final diagnoses:  Right upper quadrant abdominal pain    Clinical concern for cholelithiasis versus cholecystitis versus other. Plan for abdominal labs, bedside ultrasound, pain meds and fluids.  Patient improved on recheck. Bedside ultrasound no acute findings. Recommended a dose Rocephin in case symptoms from pyelonephritis with recent UTI. Patient has to go pick up her children and refused to stay for further evaluation.  she understands she can return at any time. She has capacity to make decisions.  Labs unremarkable.  Fup Gen surgery to discuss hida scan. Results and differential diagnosis were discussed with the patient. Close follow up outpatient was discussed, patient comfortable with the plan.   Filed Vitals:   01/25/14 0902 01/25/14  1059  BP: 127/64 103/61  Pulse: 74 84  Temp: 98 F (36.7 C)   TempSrc: Oral   Resp: 20 18  SpO2: 100% 97%          Enid SkeensJoshua M Zaccheus Edmister, MD 01/25/14 1626  Enid SkeensJoshua M Karely Hurtado, MD 02/08/14 1210

## 2014-03-09 ENCOUNTER — Encounter: Payer: Self-pay | Admitting: Advanced Practice Midwife

## 2014-03-09 ENCOUNTER — Ambulatory Visit (INDEPENDENT_AMBULATORY_CARE_PROVIDER_SITE_OTHER): Payer: Medicaid Other | Admitting: Advanced Practice Midwife

## 2014-03-09 VITALS — BP 104/64 | HR 55 | Wt 129.0 lb

## 2014-03-09 DIAGNOSIS — Z0000 Encounter for general adult medical examination without abnormal findings: Secondary | ICD-10-CM

## 2014-03-09 DIAGNOSIS — F17200 Nicotine dependence, unspecified, uncomplicated: Secondary | ICD-10-CM | POA: Insufficient documentation

## 2014-03-09 DIAGNOSIS — Z9851 Tubal ligation status: Secondary | ICD-10-CM | POA: Insufficient documentation

## 2014-03-09 DIAGNOSIS — N83209 Unspecified ovarian cyst, unspecified side: Secondary | ICD-10-CM | POA: Insufficient documentation

## 2014-03-09 DIAGNOSIS — Z01419 Encounter for gynecological examination (general) (routine) without abnormal findings: Secondary | ICD-10-CM

## 2014-03-09 DIAGNOSIS — E282 Polycystic ovarian syndrome: Secondary | ICD-10-CM | POA: Insufficient documentation

## 2014-03-09 NOTE — Progress Notes (Signed)
Subjective:     Katrina Jones is a 28 y.o. female and is here for a comprehensive physical exam. The patient reports problems - associated w/ ovarian cyst.  Patient states she has had PCOS for years and has tried metformin and OCP for symptom management and would not consider them again. States they added to the problems and did not help her symptoms. Report emotionally labile on OCP and depressed. Reports she has and a law suit out against Mirena because it needed to be laparoscopically removed from her uterus but she dropped it because of financial issures. States she would not consider birth control management for her PCOS at this time.   States she is very concerned about the size of her "adnexal" cyst and that it has enlarged. States she was told she should have it biopsied and is concerned about cancer. States in the past she has needed surgery to remove the ovaries from her pelvic bones due to adhesions.   Patient has a GI visit next week for ongoing N/V, weight loss and RUQ pain.    Patient has a son and daughter. She is separated from their father and currently in a same sex relationship.   Patient reports hx of thyroid disease and medication when she was a kid.    History   Social History  . Marital Status: Legally Separated    Spouse Name: N/A    Number of Children: N/A  . Years of Education: N/A   Occupational History  . Not on file.   Social History Main Topics  . Smoking status: Current Some Day Smoker -- 0.50 packs/day    Types: Cigarettes  . Smokeless tobacco: Never Used  . Alcohol Use: Yes     Comment: occasionally  . Drug Use: No  . Sexual Activity: Yes    Partners: Female    Birth Control/ Protection: Surgical   Other Topics Concern  . Not on file   Social History Narrative  . No narrative on file   There are no preventive care reminders to display for this patient.  Past Medical History  Diagnosis Date  . Polycystic ovarian syndrome    Past  Surgical History  Procedure Laterality Date  . Knee surgery     Family History  Problem Relation Age of Onset  . Cancer Father   . Cancer Maternal Aunt   . Cancer Paternal Grandfather   . Diabetes Paternal Grandfather   . Heart disease Paternal Grandfather        The following portions of the patient's history were reviewed and updated as appropriate: allergies, current medications, past family history, past medical history, past social history, past surgical history and problem list.  Review of Systems Constitutional: positive for anorexia Respiratory: negative Cardiovascular: negative Gastrointestinal: positive for abdominal pain, nausea and vomiting Genitourinary:positive for right lower quad pain, ongoing, denies taking medication for the pain Musculoskeletal:negative Neurological: negative Behavioral/Psych: negative Endocrine: negative   Objective:    BP 104/64  Pulse 55  Wt 129 lb (58.514 kg) General appearance: alert and cooperative Head: Normocephalic, without obvious abnormality, atraumatic Throat: lips, mucosa, and tongue normal; teeth and gums normal Neck: no adenopathy, no carotid bruit, no JVD, supple, symmetrical, trachea midline and thyroid not enlarged, symmetric, no tenderness/mass/nodules Back: symmetric, no curvature. ROM normal. No CVA tenderness. Lungs: clear to auscultation bilaterally Breasts: normal appearance, no masses or tenderness Heart: regular rate and rhythm, S1, S2 normal, no murmur, click, rub or gallop Abdomen: soft, non-tender; bowel sounds  normal; no masses,  no organomegaly Pelvic: cervix normal in appearance, external genitalia normal, no adnexal masses or tenderness, no cervical motion tenderness, rectovaginal septum normal, uterus normal size, shape, and consistency and vagina normal without discharge Extremities: extremities normal, atraumatic, no cyanosis or edema Pulses: 2+ and symmetric Skin: Skin color, texture, turgor normal.  No rashes or lesions Lymph nodes: Cervical, supraclavicular, and axillary nodes normal. Neurologic: Grossly normal    Assessment:   Well Woman Exam Patient Active Problem List   Diagnosis Date Noted  . PCOS (polycystic ovarian syndrome) 03/09/2014  . Other and unspecified ovarian cyst 03/09/2014  . H/O tubal ligation 03/09/2014  Hx of + HPV Recent sudden weight loss of 25 lbs. STI screen Hx of thryoid disease Smoker   Plan:     Patient declined medication management for PCOS. I encouraged treatment w/ OCP which patient declined due to history of emotional concerns w/ out symptom relief in the past. Patient is not open to other contraception options. Patient is also not interested in Metformin. CT shows 3.4 functional cyst on Right ovary, I explained this and the recommended treatment w/ OCP, patient declined. Patient desires biopsy and surgical intervention, consult w/ MD Tamela OddiJackson Moore.  Patient to keep GI referral for N/V and RUQ pain, pending Orders Placed This Encounter  Procedures  . GC/Chlamydia Probe Amp  . TSH  . HIV antibody  . Hepatitis B surface antigen  . Hepatitis C antibody  . RPR   Kahlen Boyde Wilson SingerWren CNM

## 2014-03-10 LAB — HEPATITIS C ANTIBODY: HCV Ab: NEGATIVE

## 2014-03-10 LAB — HEPATITIS B SURFACE ANTIGEN: HEP B S AG: NEGATIVE

## 2014-03-10 LAB — GC/CHLAMYDIA PROBE AMP
CT Probe RNA: NEGATIVE
GC Probe RNA: NEGATIVE

## 2014-03-10 LAB — TSH: TSH: 0.638 u[IU]/mL (ref 0.350–4.500)

## 2014-03-10 LAB — RPR

## 2014-03-10 LAB — HIV ANTIBODY (ROUTINE TESTING W REFLEX): HIV 1&2 Ab, 4th Generation: NONREACTIVE

## 2014-03-12 LAB — PAP IG AND HPV HIGH-RISK: HPV DNA High Risk: DETECTED — AB

## 2014-03-15 ENCOUNTER — Telehealth: Payer: Self-pay | Admitting: *Deleted

## 2014-03-15 NOTE — Telephone Encounter (Signed)
Pt called regarding lab results.   Return call to pt on 03/15/14.  Pt made aware that STD and TSH labs were WNL.  Pt advised regarding pap and +HPV noted.  Pt advised to f/u in one year for repeat pap.

## 2014-04-05 ENCOUNTER — Ambulatory Visit: Payer: Medicaid Other | Admitting: Obstetrics & Gynecology

## 2014-04-05 ENCOUNTER — Other Ambulatory Visit: Payer: Self-pay | Admitting: Gastroenterology

## 2014-04-05 DIAGNOSIS — R1011 Right upper quadrant pain: Secondary | ICD-10-CM

## 2014-04-17 ENCOUNTER — Encounter (HOSPITAL_COMMUNITY)
Admission: RE | Admit: 2014-04-17 | Discharge: 2014-04-17 | Disposition: A | Discharge status: Home / Self Care | Payer: Medicaid Other | Source: Ambulatory Visit | Attending: Gastroenterology | Admitting: Gastroenterology

## 2014-04-17 DIAGNOSIS — R1011 Right upper quadrant pain: Secondary | ICD-10-CM | POA: Insufficient documentation

## 2014-04-17 MED ORDER — TECHNETIUM TC 99M MEBROFENIN IV KIT: 5.0000 | PACK | Freq: Once | INTRAVENOUS | Status: AC | PRN

## 2014-04-17 MED ORDER — SINCALIDE 5 MCG IJ SOLR
INTRAMUSCULAR | Status: AC
  Administered 2014-04-17: 08:00:00 via INTRAVENOUS
  Filled 2014-04-17: qty 5

## 2014-04-28 ENCOUNTER — Encounter (HOSPITAL_COMMUNITY): Payer: Self-pay | Admitting: Emergency Medicine

## 2014-04-28 ENCOUNTER — Emergency Department (HOSPITAL_COMMUNITY)
Admission: EM | Admit: 2014-04-28 | Discharge: 2014-04-29 | Disposition: A | Discharge status: Home / Self Care | Payer: Medicaid Other | Attending: Emergency Medicine | Admitting: Emergency Medicine

## 2014-04-28 DIAGNOSIS — Z862 Personal history of diseases of the blood and blood-forming organs and certain disorders involving the immune mechanism: Secondary | ICD-10-CM | POA: Diagnosis not present

## 2014-04-28 DIAGNOSIS — R1013 Epigastric pain: Secondary | ICD-10-CM | POA: Diagnosis not present

## 2014-04-28 DIAGNOSIS — Z792 Long term (current) use of antibiotics: Secondary | ICD-10-CM | POA: Insufficient documentation

## 2014-04-28 DIAGNOSIS — F17200 Nicotine dependence, unspecified, uncomplicated: Secondary | ICD-10-CM | POA: Insufficient documentation

## 2014-04-28 DIAGNOSIS — R112 Nausea with vomiting, unspecified: Secondary | ICD-10-CM | POA: Insufficient documentation

## 2014-04-28 DIAGNOSIS — Z8639 Personal history of other endocrine, nutritional and metabolic disease: Secondary | ICD-10-CM | POA: Diagnosis not present

## 2014-04-28 DIAGNOSIS — J45909 Unspecified asthma, uncomplicated: Secondary | ICD-10-CM | POA: Insufficient documentation

## 2014-04-28 DIAGNOSIS — R1011 Right upper quadrant pain: Secondary | ICD-10-CM | POA: Diagnosis not present

## 2014-04-28 DIAGNOSIS — D72829 Elevated white blood cell count, unspecified: Secondary | ICD-10-CM | POA: Diagnosis not present

## 2014-04-28 DIAGNOSIS — Z3202 Encounter for pregnancy test, result negative: Secondary | ICD-10-CM | POA: Diagnosis not present

## 2014-04-28 HISTORY — DX: Unspecified asthma, uncomplicated: J45.909

## 2014-04-28 LAB — CBC WITH DIFFERENTIAL/PLATELET
Basophils Absolute: 0 10*3/uL (ref 0.0–0.1)
Basophils Relative: 0 % (ref 0–1)
EOS PCT: 3 % (ref 0–5)
Eosinophils Absolute: 0.3 10*3/uL (ref 0.0–0.7)
HCT: 43.7 % (ref 36.0–46.0)
Hemoglobin: 15.5 g/dL — ABNORMAL HIGH (ref 12.0–15.0)
LYMPHS ABS: 2.6 10*3/uL (ref 0.7–4.0)
Lymphocytes Relative: 19 % (ref 12–46)
MCH: 31.9 pg (ref 26.0–34.0)
MCHC: 35.5 g/dL (ref 30.0–36.0)
MCV: 89.9 fL (ref 78.0–100.0)
MONO ABS: 1.1 10*3/uL — AB (ref 0.1–1.0)
Monocytes Relative: 8 % (ref 3–12)
Neutro Abs: 9.7 10*3/uL — ABNORMAL HIGH (ref 1.7–7.7)
Neutrophils Relative %: 70 % (ref 43–77)
PLATELETS: 214 10*3/uL (ref 150–400)
RBC: 4.86 MIL/uL (ref 3.87–5.11)
RDW: 13.3 % (ref 11.5–15.5)
WBC: 13.8 10*3/uL — ABNORMAL HIGH (ref 4.0–10.5)

## 2014-04-28 LAB — COMPREHENSIVE METABOLIC PANEL
ALT: 10 U/L (ref 0–35)
AST: 17 U/L (ref 0–37)
Albumin: 4 g/dL (ref 3.5–5.2)
Alkaline Phosphatase: 54 U/L (ref 39–117)
Anion gap: 16 — ABNORMAL HIGH (ref 5–15)
BUN: 10 mg/dL (ref 6–23)
CALCIUM: 9.6 mg/dL (ref 8.4–10.5)
CO2: 22 mEq/L (ref 19–32)
CREATININE: 0.82 mg/dL (ref 0.50–1.10)
Chloride: 98 mEq/L (ref 96–112)
GFR calc non Af Amer: >90 mL/min (ref >90)
GLUCOSE: 111 mg/dL — AB (ref 70–99)
Potassium: 3.7 mEq/L (ref 3.7–5.3)
SODIUM: 136 meq/L — AB (ref 137–147)
Total Bilirubin: 0.5 mg/dL (ref 0.3–1.2)
Total Protein: 7.3 g/dL (ref 6.0–8.3)

## 2014-04-28 LAB — LIPASE, BLOOD: LIPASE: 21 U/L (ref 11–59)

## 2014-04-28 MED ORDER — ONDANSETRON HCL 4 MG/2ML IJ SOLN
4.0000 mg | Freq: Once | INTRAMUSCULAR | Status: AC
  Administered 2014-04-29: 4 mg via INTRAVENOUS
  Filled 2014-04-28: qty 2

## 2014-04-28 MED ORDER — SODIUM CHLORIDE 0.9 % IV BOLUS (SEPSIS)
1000.0000 mL | Freq: Once | INTRAVENOUS | Status: AC
  Administered 2014-04-29: 1000 mL via INTRAVENOUS

## 2014-04-28 NOTE — ED Notes (Signed)
patient states she has been unable to tolerate eating for 3-4 days, patient states she has been unable to drink today due to abd pain and vomiting. Patient reports dry heaving today, with 3 episodes of vomiting when drinking liquids. Patient started Bactrim today for a MRSA infection by PCP. Patient has a referral to general surgery for gallbladder.

## 2014-04-28 NOTE — ED Notes (Signed)
Patient states she does not smoke cigarettes, but uses an E cig several times daily.

## 2014-04-29 LAB — URINALYSIS, ROUTINE W REFLEX MICROSCOPIC
BILIRUBIN URINE: NEGATIVE
Glucose, UA: NEGATIVE mg/dL
Hgb urine dipstick: NEGATIVE
KETONES UR: NEGATIVE mg/dL
LEUKOCYTES UA: NEGATIVE
NITRITE: NEGATIVE
PH: 6 (ref 5.0–8.0)
PROTEIN: NEGATIVE mg/dL
Specific Gravity, Urine: 1.007 (ref 1.005–1.030)
UROBILINOGEN UA: 0.2 mg/dL (ref 0.0–1.0)

## 2014-04-29 LAB — POC URINE PREG, ED: PREG TEST UR: NEGATIVE

## 2014-04-29 MED ORDER — HYDROMORPHONE HCL PF 1 MG/ML IJ SOLN
1.0000 mg | Freq: Once | INTRAMUSCULAR | Status: AC
  Administered 2014-04-29: 1 mg via INTRAVENOUS
  Filled 2014-04-29: qty 1

## 2014-04-29 MED ORDER — ONDANSETRON 4 MG PO TBDP: ORAL_TABLET | ORAL | Status: DC

## 2014-04-29 MED ORDER — OXYCODONE-ACETAMINOPHEN 5-325 MG PO TABS: 1.0000 | ORAL_TABLET | ORAL | Status: DC | PRN

## 2014-04-29 NOTE — ED Provider Notes (Signed)
CSN: [id number]     Arrival date & time 04/28/14  2204 History   First MD Initiated Contact with Patient 04/28/14 2209     Chief Complaint  Patient presents with  . right sided pain     "on ribs"  . Emesis     (Consider location/radiation/quality/duration/timing/severity/associated sxs/prior Treatment) The history is provided by the patient and medical records. No language interpreter was used.    Katrina Jones is a 28 y.o. female  with a hx of PCOS, asthma presents to the Emergency Department complaining of gradual, persistent, progressively worsening RUQ abd pain onset 3 days ago. Pt reports HIDA scan was performed approx 1 week ago.  She reports that the gastroenterologist (Dr. Elnoria HowardHung) referred her to a surgeon for cholecystectomy due to pain with the HIDA.  She reports that Dr. Elnoria HowardHung [facility]. Associated symptoms include nausea and vomiting.  Pt reports the pain is sharp, does not radiate and is rated at a 10/10.  Nothing makes it better and eating makes it worse.  Pt denies fever, chills, headache, neck pain, chest pain, SOB, diarrhea, weakness.     Past Medical History  Diagnosis Date  . Polycystic ovarian syndrome   . Asthma    Past Surgical History  Procedure Laterality Date  . Knee surgery    . Mirina removal     Family History  Problem Relation Age of Onset  . Cancer Father   . Cancer Maternal Aunt   . Cancer Paternal Grandfather   . Diabetes Paternal Grandfather   . Heart disease Paternal Grandfather    History  Substance Use Topics  . Smoking status: Current Some Day Smoker -- 1.00 packs/day    Types: Cigarettes  . Smokeless tobacco: Never Used  . Alcohol Use: Yes     Comment: socially   OB History   Grav Para Term Preterm Abortions TAB SAB Ect Mult Living   2 2 2       2      Review of Systems  Constitutional: Negative for fever, diaphoresis, appetite change, fatigue and unexpected weight change.  HENT: Negative for  mouth sores and trouble swallowing.   Respiratory: Negative for cough, chest tightness, shortness of breath, wheezing and stridor.   Cardiovascular: Negative for chest pain and palpitations.  Gastrointestinal: Positive for nausea, vomiting and abdominal pain. Negative for diarrhea, constipation, blood in stool, abdominal distention and rectal pain.  Genitourinary: Negative for dysuria, urgency, frequency, hematuria, flank pain and difficulty urinating.  Musculoskeletal: Negative for back pain, neck pain and neck stiffness.  Skin: Negative for rash.  Neurological: Negative for weakness.  Hematological: Negative for adenopathy.  Psychiatric/Behavioral: Negative for confusion.  All other systems reviewed and are negative.     Allergies  Lactose intolerance (gi) and Other  Home Medications   Prior to Admission medications   Medication Sig Start Date End Date Taking? Authorizing Provider  PRESCRIPTION MEDICATION Inject 1 mL into the skin every other day. allery injection   Yes Historical Provider, MD  sulfamethoxazole-trimethoprim (BACTRIM DS) 800-160 MG per tablet Take 1 tablet by mouth 2 (two) times daily.   Yes Historical Provider, MD  ondansetron (ZOFRAN ODT) 4 MG disintegrating tablet 4mg  ODT q4 hours prn nausea/vomit 04/29/14   Cleopha Indelicato, PA-C  oxyCODONE-acetaminophen (PERCOCET) 5-325 MG per tablet Take 1-2 tablets by mouth every 4 (four) hours as needed. 04/29/14   Noura Purpura, PA-C   BP 101/84  Pulse 58  Temp(Src) 98.3 F (36.8  C) (Oral)  Resp 17  SpO2 100%  LMP 04/19/2014 Physical Exam  Nursing note and vitals reviewed. Constitutional: She is oriented to person, place, and time. She appears well-developed and well-nourished. She appears distressed.  Awake, alert, nontoxic appearance  HENT:  Head: Normocephalic and atraumatic.  Mouth/Throat: Oropharynx is clear and moist. No oropharyngeal exudate.  Eyes: Conjunctivae are normal. No scleral icterus.  Neck:  Normal range of motion. Neck supple.  Cardiovascular: Normal rate, regular rhythm, normal heart sounds and intact distal pulses.   No murmur heard. Pulmonary/Chest: Effort normal and breath sounds normal. No respiratory distress. She has no wheezes.  Abdominal: Soft. Normal appearance and bowel sounds are normal. She exhibits no distension and no mass. There is tenderness in the right upper quadrant and epigastric area. There is guarding. There is no rebound and no CVA tenderness.  Musculoskeletal: Normal range of motion. She exhibits no edema.  Lymphadenopathy:    She has no cervical adenopathy.  Neurological: She is alert and oriented to person, place, and time. Coordination normal.  Speech is clear and goal oriented Moves extremities without ataxia  Skin: Skin is warm and dry. She is not diaphoretic. No erythema.  Psychiatric: She has a normal mood and affect. Her behavior is normal.  Tearful    ED Course  Procedures (including critical care time) Labs Review Labs Reviewed  CBC WITH DIFFERENTIAL - Abnormal; Notable for the following:    WBC 13.8 (*)    Hemoglobin 15.5 (*)    Neutro Abs 9.7 (*)    Monocytes Absolute 1.1 (*)    All other components within normal limits  COMPREHENSIVE METABOLIC PANEL - Abnormal; Notable for the following:    Sodium 136 (*)    Glucose, Bld 111 (*)    Anion gap 16 (*)    All other components within normal limits  LIPASE, BLOOD  URINALYSIS, ROUTINE W REFLEX MICROSCOPIC  POC URINE PREG, ED    Imaging Review No results found.   EKG Interpretation None      Angiocath insertion Performed by: Dierdre Forth  Consent: Verbal consent obtained. Risks and benefits: risks, benefits and alternatives were discussed Time out: Immediately prior to procedure a "time out" was called to verify the correct patient, procedure, equipment, support staff and site/side marked as required.  Preparation: Patient was prepped and draped in the usual  sterile fashion.  Vein Location: L hand  Not Ultrasound Guided  Gauge: 22ga  Normal blood return and flush without difficulty Patient tolerance: Patient tolerated the procedure well with no immediate complications.     MDM   Final diagnoses:  RUQ abdominal pain   Katrina Jones presents with upper quadrant abdominal pain. Patient has been seen by Dr. Elnoria Howard who performed a HIDA scan on 04/17/14.  HIDA scan with normal appearing there is ejection fraction is 75% however patient did experience pain during infusion of CCK.  Patient was evaluated for the same pain on 01/25/2014 and right upper quadrant abdominal ultrasound at that time showed no gallstones, thickening of the gallbladder wall or sonographic Murphy sign.  Patient reports she has not yet heard from [facility] to schedule her appointment but is concerned about how to handle her vomiting and pain until then.  Patient is in significant distress, very uncomfortable and tearful.  Labs reassuring.  Mild leukocytosis of 13.8 but normal AST, ALT and lipase.    1:31 AM Patient given fluid bolus, Dilaudid and antiemetic. She requests by mouth trial at this time.  Abdomen soft and nontender.  Patient is afebrile and not tachycardic. Doubt cholecystitis or choledocholithiasis.  If no emesis, will plan for discharge home with pain control and nausea medication. Patient is to call [facility] tomorrow morning to schedule an appointment.  If she is unable to reach them she is to contact Dr. Elnoria Howard for further instruction.   I have personally reviewed patient's vitals, nursing note and any pertinent labs or imaging.  I performed an undressed physical exam.    Pt care transferred to Marlon Pel, PA-C.  PO trial in progress. If pt tolerates PO, she may be d/c home; if she continues to vomit she will need admission for intractable vomiting.    BP 101/84  Pulse 58  Temp(Src) 98.3 F (36.8 C) (Oral)  Resp 17  SpO2 100%  LMP  04/19/2014   Dierdre Forth, PA-C 04/29/14 0141

## 2014-04-29 NOTE — Discharge Instructions (Signed)
1. Medications: percocet, usual home medications 2. Treatment: rest, drink plenty of fluids, take medications as prescribed 3. Follow Up: Please followup with Dr. Elnoria Howard and [facility] TOMORROW for discussion of your diagnoses and further evaluation after today's visit; if you do not have a primary care doctor use the resource guide provided to find one;     Biliary Colic  Biliary colic is a steady or irregular pain in the upper abdomen. It is usually under the right side of the rib cage. It happens when gallstones interfere with the normal flow of bile from the gallbladder. Bile is a liquid that helps to digest fats. Bile is made in the liver and stored in the gallbladder. When you eat a meal, bile passes from the gallbladder through the cystic duct and the common bile duct into the small intestine. There, it mixes with partially digested food. If a gallstone blocks either of these ducts, the normal flow of bile is blocked. The muscle cells in the bile duct contract forcefully to try to move the stone. This causes the pain of biliary colic.  SYMPTOMS   A person with biliary colic usually complains of pain in the upper abdomen. This pain can be:  In the center of the upper abdomen just below the breastbone.  In the upper-right part of the abdomen, near the gallbladder and liver.  Spread back toward the right shoulder blade.  Nausea and vomiting.  The pain usually occurs after eating.  Biliary colic is usually triggered by the digestive system's demand for bile. The demand for bile is high after fatty meals. Symptoms can also occur when a person who has been fasting suddenly eats a very large meal. Most episodes of biliary colic pass after 1 to 5 hours. After the most intense pain passes, your abdomen may continue to ache mildly for about 24 hours. DIAGNOSIS  After you describe your symptoms, your caregiver will perform a physical exam. He or she will pay attention to the upper  right portion of your belly (abdomen). This is the area of your liver and gallbladder. An ultrasound will help your caregiver look for gallstones. Specialized scans of the gallbladder may also be done. Blood tests may be done, especially if you have fever or if your pain persists. PREVENTION  Biliary colic can be prevented by controlling the risk factors for gallstones. Some of these risk factors, such as heredity, increasing age, and pregnancy are a normal part of life. Obesity and a high-fat diet are risk factors you can change through a healthy lifestyle. Women going through menopause who take hormone replacement therapy (estrogen) are also more likely to develop biliary colic. TREATMENT   Pain medication may be prescribed.  You may be encouraged to eat a fat-free diet.  If the first episode of biliary colic is severe, or episodes of colic keep retuning, surgery to remove the gallbladder (cholecystectomy) is usually recommended. This procedure can be done through small incisions using an instrument called a laparoscope. The procedure often requires a brief stay in the hospital. Some people can leave the hospital the same day. It is the most widely used treatment in people troubled by painful gallstones. It is effective and safe, with no complications in more than 90% of cases.  If surgery cannot be done, medication that dissolves gallstones may be used. This medication is expensive and can take months or years to work. Only small stones will dissolve.  Rarely, medication to dissolve gallstones is combined with  a procedure called shock-wave lithotripsy. This procedure uses carefully aimed shock waves to break up gallstones. In many people treated with this procedure, gallstones form again within a few years. PROGNOSIS  If gallstones block your cystic duct or common bile duct, you are at risk for repeated episodes of biliary colic. There is also a 25% chance that you will develop a gallbladder  infection(acute cholecystitis), or some other complication of gallstones within 10 to 20 years. If you have surgery, schedule it at a time that is convenient for you and at a time when you are not sick. HOME CARE INSTRUCTIONS   Drink plenty of clear fluids.  Avoid fatty, greasy or fried foods, or any foods that make your pain worse.  Take medications as directed. SEEK MEDICAL CARE IF:   You develop a fever over 100.5 F (38.1 C).  Your pain gets worse over time.  You develop nausea that prevents you from eating and drinking.  You develop vomiting. SEEK IMMEDIATE MEDICAL CARE IF:   You have continuous or severe belly (abdominal) pain which is not relieved with medications.  You develop nausea and vomiting which is not relieved with medications.  You have symptoms of biliary colic and you suddenly develop a fever and shaking chills. This may signal cholecystitis. Call your caregiver immediately.  You develop a yellow color to your skin or the white part of your eyes (jaundice). Document Released: 03/01/2006 Document Revised: 12/21/2011 Document Reviewed: 05/10/2008 [facility] Patient Information 2015 [place], [place]. This information is not intended to replace advice given to you by your health care provider. Make sure you discuss any questions you have with your health care provider.    Emergency Department Resource Guide 1) Find a Doctor and Pay Out of Pocket Although you won't have to find out who is covered by your insurance plan, it is a good idea to ask around and get recommendations. You will then need to call the office and see if the doctor you have chosen will accept you as a new patient and what types of options they offer for patients who are self-pay. Some doctors offer discounts or will set up payment plans for their patients who do not have insurance, but you will need to ask so you aren't surprised when you get to your appointment.  2) Contact Your Local Health  Department Not all health departments have doctors that can see patients for sick visits, but many do, so it is worth a call to see if yours does. If you don't know where your local health department is, you can check in your phone book. The CDC also has a tool to help you locate your state's health department, and many state websites also have listings of all of their local health departments.  3) Find a Walk-in Clinic If your illness is not likely to be very severe or complicated, you may want to try a walk in clinic. These are popping up all over the country in pharmacies, drugstores, and shopping centers. They're usually staffed by nurse practitioners or physician assistants that have been trained to treat common illnesses and complaints. They're usually fairly quick and inexpensive. However, if you have serious medical issues or chronic medical problems, these are probably not your best option.  No Primary Care Doctor: - Call Health Connect at  [phone] - they can help you locate a primary care doctor that  accepts your insurance, provides certain services, etc. - Physician Referral Service- [phone]  Chronic Pain Problems:  Organization         Address  Phone   Notes  [facility]  [phone] Patients need to be referred by their primary care doctor.   Medication Assistance: Organization         Address  Phone   Notes  [facility] Medication [facility] [address] [place], [place] [address] [phone] --Must be a resident of [facility] -- Must have NO insurance coverage whatsoever (no Medicaid/ Medicare, etc.) -- The pt. MUST have a primary care doctor that directs their care regularly and follows them in the community   [facility]  [phone]   [employer]  [phone]    Agencies that provide inexpensive medical care: Organization         Address  Phone   Notes  [facility]  [phone]   [facility]    [phone]   [facility] [address] [place], [place] [address] [phone]   [facility] [address], [place] [phone]   Planned Parenthood    [phone]   [facility]    [phone]   [facility]  [address], [place] Phone:  [phone], Fax:  [phone] Hours of Operation:  9 am - 6 pm, M-F.  Also accepts Medicaid/Medicare and self-pay.  [facility]  [address], [place] Phone: [phone], Fax: [phone]. Hours of Operation:  8:30 am - 5:30 pm, M-F.  Also accepts Medicaid and self-pay.  [facility] [place] [address], [place] Phone: [phone]   [facility] [address] Natasha Bence [place], [place] [phone], Ext. [phone] Mondays & Thursdays: 7-9 AM.  First 15 patients are seen on a first come, first serve basis.    Medicaid-accepting [facility] Providers:  Organization         Address  Phone   Notes  [facility] [address], [place] [phone] Also accepts self-pay patients.  [facility] [address] Laurell Josephs [place], [place]  [phone]   [facility] [address], [place] [phone]   [facility] [address], [place] [phone]   Renaye Rakers [address], [place]   [phone] Only accepts [place] patients after they have their name applied to their card.   Self-Pay (no insurance) in [facility]:  Organization         Address  Phone   Notes  Sickle Cell Patients, [facility] Internal Medicine [address] [place], [place] [phone]   [facility] Urgent Care [address] [place], [place] [phone]   [facility]  [address],  [place] [phone]   [facility]/Dr. Osei-Bonsu  [address], [place] or [address], [place] [phone] Phone number for both [place] and [place] locations is the same.  [facility] [address] [place] [phone]   [facility] [address], [place] or [address] [phone]   [facility] [address] [place] [phone], phone; [phone], fax Sees patients 1st and  3rd Saturday of every month.  Must not qualify for public or private insurance (i.e. Medicaid, Medicare, [place] Health Choice, Veterans' Benefits)  Household income should be no more than 200% of the poverty level The clinic cannot treat you if you are pregnant or think you are pregnant  Sexually transmitted diseases are not treated at the clinic.    Dental Care: Organization         Address  Phone  Notes  [facility] Department of [facility] [address] [place], [place] [phone] Accepts children up to age 67 who are enrolled in [place] or [place] Health Choice; pregnant women with a Medicaid card; and children who have applied for Medicaid or [place] Health Choice, but were declined, whose parents can pay a reduced fee at time of service.  [facility] of [facility]  [address], [place] [phone] Accepts children up to age 1 who are enrolled in [place] or [place] Health Choice; pregnant women with a Medicaid card; and children who have applied for Medicaid or [place] Health Choice, but were declined, whose parents can pay a reduced fee at time of service.  [facility]  [address] [place], [place] [phone] Patients are seen by appointment only. Walk-ins are not accepted. [facility] will see patients 2 years of age and older. Monday - Tuesday (8am-5pm) Most Wednesdays  (8:30-5pm) $30 per visit, cash only  [facility]  [address], [facility] [phone] Patients are seen by appointment only. Walk-ins are not accepted. [facility] will see patients 72 years of age and older. One Wednesday Evening (Monthly: Volunteer Based).  $30 per visit, cash only  [employer]  [phone] for adults; Children under age 15, call Graduate Pediatric Dentistry at [phone]. Children aged 7-14, please call [phone] to request a pediatric application.  Dental services are provided in all areas of dental care including fillings, crowns and bridges, complete and partial dentures, implants, gum treatment, root canals, and extractions. Preventive care is also provided. Treatment is provided to both adults and children. Patients are selected via a lottery and there is often a waiting list.   [facility] [address] [place]  [phone] [email or web address]   [facility] [address] [place], [place] [phone], Ext. [phone] Second and Fourth Thursday of each month, opens at 6:30 AM; Clinic ends at 9 AM.  Patients are seen on a first-come first-served basis, and a limited number are seen during each clinic.   [facility]  [address] Ether Griffins [place], [place] [phone]   Eligibility Requirements You must have lived in [place], [place], or [place] counties for at least the last three months.   You cannot be eligible for state or federal sponsored [place], including [employer], [place], or [employer].   You generally cannot be eligible for healthcare insurance through your employer.    How to apply: Eligibility screenings are held every Tuesday and Wednesday afternoon from 1:00 pm until 4:00 pm. You do not need an appointment for the interview!  [facility] [address] [place], [place] [phone]   [facility]  Health Department  [phone]   [facility] Health Department  [phone]   [facility] Health Department  [phone]    Behavioral Health Resources in the Community: Intensive Outpatient Programs Organization  Address  Phone  Notes  [facility] [address], [place], [place] [phone]   [facility] [address], [place], [place] [phone]   [facility] [address], [place], [place]  [phone]   [facility] [address],  [place], [place] [phone] or [phone]   Substance Abuse Resources Organization         Address  Phone  Notes  [facility]  [phone]   [facility]  [phone]   [facility]  [phone]   [facility]  [phone]   Residential & Outpatient Substance Abuse Program  [phone]   Psychological Services Organization         Address  Phone  Notes  [facility]  [phone]- [phone]   [facility]  [phone]- [phone]   [facility] [address], [place] [phone] or [phone]    Mobile Crisis Teams Organization         Address  Phone  Notes  [facility], Mobile Crisis Care Unit  [phone]   [facility]  [address] [place], [place] [phone]   Doristine LocksSharon DeEsch [address] [place] [phone]    Self-Help/Support Groups Organization         Address  Phone             Notes  [facility] [place] - variety of support groups  [phone] Call for more information  Narcotics Anonymous (NA), [facility]aring [facility] [address], [place]  2 meetings at this location   Statisticianesidential Treatment Programs Organization         Address  Phone  Notes  [facility] [address],    [place]  [phone]   [facility]  [address], [place] [phone], [place], [place]  [phone]   [facility] [address], [place] [phone] Admissions: 8am-3pm M-F  [facility] [address]    [place], [place] [phone]   [facility] [address], [place], [place] [phone]   [facility] [address]  [place], [place] [phone]   [facility] - Intensive Outpatient [address], [place], [place] [phone]   [facility] ([facility] [address]  [place], [place] [phone] or [phone]   [facility] ([facility]) [address] [place], [place] [phone] Accepts Medicaid  [facility] [address]  [place] [phone] Substance Abuse/Addiction Treatment   [facility] Resources Organization         Address  Phone  Notes  [facility]  [phone]) [phone]   Angie FavaJulie Brannon, PhD [address] [place], [place]   [phone]) [phone] or [phone]   [facility]   [address] [place], [place] [phone]   [facility] [address], [place], [place] [phone]) [phone] Insurance/Medicaid/sponsorship through [facility]  [facility] Families [address]                                    [place], [place] [phone] Therapy/tele-psych/case  [facility] [address]   [place], [place] [phone]    Dr. Lolly MustacheArfeen  [phone]) [phone]   [facility]  [place] Dept. 1) [address], [place] 2) [place] 3)  [address], [place] [phone]  [phone]   [place] [phone] or [phone] (After Hours)

## 2014-04-29 NOTE — ED Notes (Signed)
Iv start attempt x2 unsuccessful.

## 2014-04-29 NOTE — ED Provider Notes (Signed)
Medical screening examination/treatment/procedure(s) were performed by non-physician practitioner and as supervising physician I was immediately available for consultation/collaboration.   EKG Interpretation None       Merritt Kibby M Rilynn Habel, MD 04/29/14 0545 

## 2014-05-17 ENCOUNTER — Encounter (INDEPENDENT_AMBULATORY_CARE_PROVIDER_SITE_OTHER): Payer: Self-pay | Admitting: General Surgery

## 2014-05-17 ENCOUNTER — Ambulatory Visit (INDEPENDENT_AMBULATORY_CARE_PROVIDER_SITE_OTHER): Payer: Medicaid Other | Admitting: General Surgery

## 2014-05-17 VITALS — BP 118/70 | HR 58 | Temp 97.4°F | Ht <= 58 in | Wt 120.0 lb

## 2014-05-17 DIAGNOSIS — K828 Other specified diseases of gallbladder: Secondary | ICD-10-CM

## 2014-05-17 NOTE — Progress Notes (Signed)
Patient ID: Katrina Jones, female   DOB: 06/13/1986, 28 y.o.   MRN: [id number]  Chief Complaint  Patient presents with  . Abdominal Pain    HPI Katrina HornsSonja Jones is a 28 y.o. female.  The patient is a 28 year old female is referred by Dr. Elnoria HowardHung for an evaluation of right upper quadrant pain. The patient has had numerous workups to include ultrasound, CT scan, and HIDA scan which have been normal. The patient did state that when she had CCK injected with a HIDA scan she did have reproducible right upper quadrant pain. The patient states she's had nausea vomiting and pain over the last 8 months. States it's progressively gotten worse. At this time she states anything she eats or drinks right upper quadrant pain.  HPI  Past Medical History  Diagnosis Date  . Polycystic ovarian syndrome   . Asthma     Past Surgical History  Procedure Laterality Date  . Knee surgery    . Mirina removal      Family History  Problem Relation Age of Onset  . Cancer Father   . Cancer Maternal Aunt   . Cancer Paternal Grandfather   . Diabetes Paternal Grandfather   . Heart disease Paternal Grandfather     Social History History  Substance Use Topics  . Smoking status: Current Some Day Smoker -- 1.00 packs/day    Types: Cigarettes  . Smokeless tobacco: Never Used  . Alcohol Use: Yes     Comment: socially    Allergies  Allergen Reactions  . Lactose Intolerance (Gi) Other (See Comments)    Upset stomach  . Other Other (See Comments)    Mold and dust allergy    Current Outpatient Prescriptions  Medication Sig Dispense Refill  . ondansetron (ZOFRAN ODT) 4 MG disintegrating tablet 4mg  ODT q4 hours prn nausea/vomit  12 tablet  1  . PRESCRIPTION MEDICATION Inject 1 mL into the skin every other day. allery injection      . sulfamethoxazole-trimethoprim (BACTRIM DS) 800-160 MG per tablet Take 1 tablet by mouth 2 (two) times daily.       No current facility-administered medications for this visit.     Review of Systems Review of Systems  Constitutional: Negative.   HENT: Negative.   Respiratory: Negative.   Cardiovascular: Negative.   Gastrointestinal: Negative.   Neurological: Negative.   All other systems reviewed and are negative.   Blood pressure 118/70, pulse 58, temperature 97.4 F (36.3 C), height 4\' 9"  (1.448 m), weight 120 lb (54.432 kg), last menstrual period 04/19/2014.  Physical Exam Physical Exam  Constitutional: She is oriented to person, place, and time. She appears well-developed and well-nourished.  HENT:  Head: Normocephalic and atraumatic.  Eyes: Conjunctivae and EOM are normal. Pupils are equal, round, and reactive to light.  Neck: Normal range of motion. Neck supple.  Cardiovascular: Normal rate, regular rhythm and normal heart sounds.   Pulmonary/Chest: Effort normal and breath sounds normal.  Abdominal: Soft. Bowel sounds are normal. She exhibits no distension and no mass. There is no tenderness. There is no rebound and no guarding.  Musculoskeletal: Normal range of motion.  Neurological: She is alert and oriented to person, place, and time.  Skin: Skin is warm and dry.  Psychiatric: She has a normal mood and affect.    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     Plan    1. We'll proceed to the operating room for a  laparoscopic cholecystectomy 2.All risks and benefits were discussed with the patient to generally include: infection, bleeding, possible need for post op ERCP, damage to the bile ducts, and bile leak. Alternatives were offered and described.  All questions were answered and the patient voiced understanding of the procedure and wishes to proceed at this point with a laparoscopic cholecystectomy         Marigene Ehlers., Jed Limerick 05/17/2014, 11:34 AM

## 2014-05-18 NOTE — Pre-Procedure Instructions (Signed)
Katrina HornsSonja Levels  05/18/2014   Your procedure is scheduled on: Tuesday, August 11.  Report to [facility] Admitting at 1:45 PM.  Call this number if you have problems the morning of surgery: [phone]   Remember:   Do not eat food or drink liquids after midnight Monday, August 10.   Take these medicines the morning of surgery with A SIP OF WATER:  Take if needed: ondansetron (ZOFRAN-ODT).   Do not wear jewelry, make-up or nail polish.  Do not wear lotions, powders, or perfumes.               Do not shave 48 hours prior to surgery.   Do not bring valuables to the hospital.              [facility] is not responsible for any belongings or valuables.               Contacts, dentures or bridgework may not be worn into surgery.  Leave suitcase in the car. After surgery it may be brought to your room.  For patients admitted to the hospital, discharge time is determined by your treatment team.               Patients discharged the day of surgery will not be allowed to drive home.  Name and phone number of your driver: -   Special Instructions: Review  [facility] - Preparing For Surgery.   Please read over the following fact sheets that you were given: Pain Booklet, Coughing and Deep Breathing and Surgical Site Infection Prevention

## 2014-05-21 ENCOUNTER — Encounter (HOSPITAL_COMMUNITY): Payer: Self-pay

## 2014-05-21 ENCOUNTER — Encounter (HOSPITAL_COMMUNITY)
Admission: RE | Admit: 2014-05-21 | Discharge: 2014-05-21 | Disposition: A | Discharge status: Home / Self Care | Payer: Medicaid Other | Source: Ambulatory Visit | Attending: General Surgery | Admitting: General Surgery

## 2014-05-21 DIAGNOSIS — Z9109 Other allergy status, other than to drugs and biological substances: Secondary | ICD-10-CM | POA: Diagnosis not present

## 2014-05-21 DIAGNOSIS — K811 Chronic cholecystitis: Secondary | ICD-10-CM | POA: Diagnosis not present

## 2014-05-21 DIAGNOSIS — E282 Polycystic ovarian syndrome: Secondary | ICD-10-CM | POA: Diagnosis not present

## 2014-05-21 DIAGNOSIS — F17200 Nicotine dependence, unspecified, uncomplicated: Secondary | ICD-10-CM | POA: Diagnosis not present

## 2014-05-21 DIAGNOSIS — J45909 Unspecified asthma, uncomplicated: Secondary | ICD-10-CM | POA: Diagnosis not present

## 2014-05-21 DIAGNOSIS — K828 Other specified diseases of gallbladder: Secondary | ICD-10-CM | POA: Diagnosis present

## 2014-05-21 HISTORY — DX: Anxiety disorder, unspecified: F41.9

## 2014-05-21 HISTORY — DX: Unspecified osteoarthritis, unspecified site: M19.90

## 2014-05-21 LAB — HCG, SERUM, QUALITATIVE: Preg, Serum: NEGATIVE

## 2014-05-21 LAB — CBC
HEMATOCRIT: 44.7 % (ref 36.0–46.0)
Hemoglobin: 15.1 g/dL — ABNORMAL HIGH (ref 12.0–15.0)
MCH: 31.6 pg (ref 26.0–34.0)
MCHC: 33.8 g/dL (ref 30.0–36.0)
MCV: 93.5 fL (ref 78.0–100.0)
Platelets: 202 10*3/uL (ref 150–400)
RBC: 4.78 MIL/uL (ref 3.87–5.11)
RDW: 13.5 % (ref 11.5–15.5)
WBC: 8.3 10*3/uL (ref 4.0–10.5)

## 2014-05-21 LAB — BASIC METABOLIC PANEL
Anion gap: 14 (ref 5–15)
BUN: 8 mg/dL (ref 6–23)
CALCIUM: 9.8 mg/dL (ref 8.4–10.5)
CHLORIDE: 104 meq/L (ref 96–112)
CO2: 25 mEq/L (ref 19–32)
CREATININE: 0.63 mg/dL (ref 0.50–1.10)
GFR calc non Af Amer: >90 mL/min (ref >90)
Glucose, Bld: 89 mg/dL (ref 70–99)
Potassium: 4.7 mEq/L (ref 3.7–5.3)
Sodium: 143 mEq/L (ref 137–147)

## 2014-05-21 MED ORDER — CHLORHEXIDINE GLUCONATE 4 % EX LIQD
1.0000 "application " | Freq: Once | CUTANEOUS | Status: DC
  Filled 2014-05-21: qty 15

## 2014-05-21 MED ORDER — CEFAZOLIN SODIUM-DEXTROSE 2-3 GM-% IV SOLR
2.0000 g | INTRAVENOUS | Status: AC
  Administered 2014-05-22: 2 g via INTRAVENOUS
  Filled 2014-05-21: qty 50

## 2014-05-22 ENCOUNTER — Encounter (HOSPITAL_COMMUNITY)
Admission: RE | Disposition: A | Discharge status: Home / Self Care | Payer: Self-pay | Source: Ambulatory Visit | Attending: General Surgery

## 2014-05-22 ENCOUNTER — Encounter (HOSPITAL_COMMUNITY): Payer: Medicaid Other | Admitting: Anesthesiology

## 2014-05-22 ENCOUNTER — Ambulatory Visit (HOSPITAL_COMMUNITY)
Admission: RE | Admit: 2014-05-22 | Discharge: 2014-05-22 | Disposition: A | Discharge status: Home / Self Care | Payer: Medicaid Other | Source: Ambulatory Visit | Attending: General Surgery | Admitting: General Surgery

## 2014-05-22 ENCOUNTER — Ambulatory Visit (HOSPITAL_COMMUNITY): Payer: Medicaid Other | Admitting: Anesthesiology

## 2014-05-22 ENCOUNTER — Encounter (HOSPITAL_COMMUNITY): Payer: Self-pay | Admitting: *Deleted

## 2014-05-22 DIAGNOSIS — E282 Polycystic ovarian syndrome: Secondary | ICD-10-CM | POA: Insufficient documentation

## 2014-05-22 DIAGNOSIS — Z9109 Other allergy status, other than to drugs and biological substances: Secondary | ICD-10-CM | POA: Insufficient documentation

## 2014-05-22 DIAGNOSIS — F17200 Nicotine dependence, unspecified, uncomplicated: Secondary | ICD-10-CM | POA: Insufficient documentation

## 2014-05-22 DIAGNOSIS — K828 Other specified diseases of gallbladder: Secondary | ICD-10-CM | POA: Insufficient documentation

## 2014-05-22 DIAGNOSIS — K811 Chronic cholecystitis: Secondary | ICD-10-CM

## 2014-05-22 DIAGNOSIS — J45909 Unspecified asthma, uncomplicated: Secondary | ICD-10-CM | POA: Diagnosis not present

## 2014-05-22 HISTORY — PX: CHOLECYSTECTOMY: SHX55

## 2014-05-22 SURGERY — LAPAROSCOPIC CHOLECYSTECTOMY
Anesthesia: General

## 2014-05-22 MED ORDER — HYDROMORPHONE HCL PF 1 MG/ML IJ SOLN
INTRAMUSCULAR | Status: DC | PRN
  Administered 2014-05-22: 1 mg via INTRAVENOUS

## 2014-05-22 MED ORDER — FENTANYL CITRATE 0.05 MG/ML IJ SOLN
25.0000 ug | INTRAMUSCULAR | Status: DC | PRN
  Administered 2014-05-22 (×2): 50 ug via INTRAVENOUS

## 2014-05-22 MED ORDER — LACTATED RINGERS IV SOLN
INTRAVENOUS | Status: DC
  Administered 2014-05-22 (×2): via INTRAVENOUS

## 2014-05-22 MED ORDER — ROCURONIUM BROMIDE 50 MG/5ML IV SOLN
INTRAVENOUS | Status: AC
  Filled 2014-05-22: qty 1

## 2014-05-22 MED ORDER — NEOSTIGMINE METHYLSULFATE 10 MG/10ML IV SOLN
INTRAVENOUS | Status: DC | PRN
  Administered 2014-05-22: 4 mg via INTRAVENOUS

## 2014-05-22 MED ORDER — PROMETHAZINE HCL 25 MG/ML IJ SOLN: 6.2500 mg | INTRAMUSCULAR | Status: DC | PRN

## 2014-05-22 MED ORDER — DEXMEDETOMIDINE HCL IN NACL 200 MCG/50ML IV SOLN
INTRAVENOUS | Status: AC
  Filled 2014-05-22: qty 50

## 2014-05-22 MED ORDER — MEPERIDINE HCL 25 MG/ML IJ SOLN: 6.2500 mg | INTRAMUSCULAR | Status: DC | PRN

## 2014-05-22 MED ORDER — DIPHENHYDRAMINE HCL 50 MG/ML IJ SOLN
6.2500 mg | Freq: Once | INTRAMUSCULAR | Status: AC
  Administered 2014-05-22: 6.25 mg via INTRAVENOUS

## 2014-05-22 MED ORDER — DEXMEDETOMIDINE HCL 200 MCG/2ML IV SOLN
INTRAVENOUS | Status: DC | PRN
  Administered 2014-05-22: 10 ug via INTRAVENOUS
  Administered 2014-05-22: 20 ug via INTRAVENOUS

## 2014-05-22 MED ORDER — FENTANYL CITRATE 0.05 MG/ML IJ SOLN
INTRAMUSCULAR | Status: AC
  Filled 2014-05-22: qty 5

## 2014-05-22 MED ORDER — FENTANYL CITRATE 0.05 MG/ML IJ SOLN
INTRAMUSCULAR | Status: DC | PRN
  Administered 2014-05-22 (×2): 50 ug via INTRAVENOUS
  Administered 2014-05-22: 100 ug via INTRAVENOUS
  Administered 2014-05-22: 50 ug via INTRAVENOUS

## 2014-05-22 MED ORDER — FENTANYL CITRATE 0.05 MG/ML IJ SOLN
INTRAMUSCULAR | Status: AC
  Filled 2014-05-22: qty 2

## 2014-05-22 MED ORDER — 0.9 % SODIUM CHLORIDE (POUR BTL) OPTIME
TOPICAL | Status: DC | PRN
  Administered 2014-05-22: 1000 mL

## 2014-05-22 MED ORDER — ONDANSETRON HCL 4 MG/2ML IJ SOLN
INTRAMUSCULAR | Status: AC
  Filled 2014-05-22: qty 2

## 2014-05-22 MED ORDER — LIDOCAINE HCL (CARDIAC) 20 MG/ML IV SOLN
INTRAVENOUS | Status: AC
  Filled 2014-05-22: qty 5

## 2014-05-22 MED ORDER — SODIUM CHLORIDE 0.9 % IR SOLN
Status: DC | PRN
Start: 2014-05-22 — End: 2014-05-22
  Administered 2014-05-22: 1000 mL

## 2014-05-22 MED ORDER — GLYCOPYRROLATE 0.2 MG/ML IJ SOLN
INTRAMUSCULAR | Status: DC | PRN
  Administered 2014-05-22: 0.6 mg via INTRAVENOUS

## 2014-05-22 MED ORDER — ROCURONIUM BROMIDE 100 MG/10ML IV SOLN
INTRAVENOUS | Status: DC | PRN
  Administered 2014-05-22: 30 mg via INTRAVENOUS

## 2014-05-22 MED ORDER — BUPIVACAINE HCL 0.25 % IJ SOLN
INTRAMUSCULAR | Status: DC | PRN
  Administered 2014-05-22: 30 mL

## 2014-05-22 MED ORDER — LIDOCAINE HCL (CARDIAC) 20 MG/ML IV SOLN
INTRAVENOUS | Status: DC | PRN
  Administered 2014-05-22: 40 mg via INTRAVENOUS

## 2014-05-22 MED ORDER — ONDANSETRON HCL 4 MG/2ML IJ SOLN
INTRAMUSCULAR | Status: DC | PRN
  Administered 2014-05-22: 4 mg via INTRAVENOUS

## 2014-05-22 MED ORDER — DIPHENHYDRAMINE HCL 50 MG/ML IJ SOLN
INTRAMUSCULAR | Status: AC
  Filled 2014-05-22: qty 1

## 2014-05-22 MED ORDER — HYDROMORPHONE HCL PF 1 MG/ML IJ SOLN
INTRAMUSCULAR | Status: AC
  Filled 2014-05-22: qty 1

## 2014-05-22 MED ORDER — OXYCODONE-ACETAMINOPHEN 5-325 MG PO TABS: 1.0000 | ORAL_TABLET | ORAL | Status: DC | PRN

## 2014-05-22 MED ORDER — NEOSTIGMINE METHYLSULFATE 10 MG/10ML IV SOLN
INTRAVENOUS | Status: AC
  Filled 2014-05-22: qty 1

## 2014-05-22 MED ORDER — MIDAZOLAM HCL 5 MG/5ML IJ SOLN
INTRAMUSCULAR | Status: DC | PRN
  Administered 2014-05-22: 2 mg via INTRAVENOUS

## 2014-05-22 MED ORDER — PROPOFOL 10 MG/ML IV BOLUS
INTRAVENOUS | Status: AC
  Filled 2014-05-22: qty 20

## 2014-05-22 MED ORDER — SCOPOLAMINE 1 MG/3DAYS TD PT72
1.0000 | MEDICATED_PATCH | TRANSDERMAL | Status: DC
  Administered 2014-05-22: 1 via TRANSDERMAL

## 2014-05-22 MED ORDER — MIDAZOLAM HCL 2 MG/2ML IJ SOLN
INTRAMUSCULAR | Status: AC
Start: 2014-05-22 — End: 2014-05-22
  Filled 2014-05-22: qty 2

## 2014-05-22 MED ORDER — DEXAMETHASONE SODIUM PHOSPHATE 4 MG/ML IJ SOLN
INTRAMUSCULAR | Status: AC
  Filled 2014-05-22: qty 1

## 2014-05-22 MED ORDER — PROPOFOL 10 MG/ML IV BOLUS
INTRAVENOUS | Status: DC | PRN
  Administered 2014-05-22: 150 mg via INTRAVENOUS
  Administered 2014-05-22: 50 mg via INTRAVENOUS

## 2014-05-22 MED ORDER — DEXAMETHASONE SODIUM PHOSPHATE 4 MG/ML IJ SOLN
INTRAMUSCULAR | Status: DC | PRN
  Administered 2014-05-22: 4 mg via INTRAVENOUS

## 2014-05-22 SURGICAL SUPPLY — 45 items
BENZOIN TINCTURE PRP APPL 2/3 (GAUZE/BANDAGES/DRESSINGS) ×3 IMPLANT
CANISTER SUCTION 2500CC (MISCELLANEOUS) ×3 IMPLANT
CHLORAPREP W/TINT 26ML (MISCELLANEOUS) ×3 IMPLANT
CLIP LIGATING HEMO O LOK GREEN (MISCELLANEOUS) ×3 IMPLANT
CLOSURE WOUND 1/2 X4 (GAUZE/BANDAGES/DRESSINGS) ×1
COVER MAYO STAND STRL (DRAPES) IMPLANT
COVER SURGICAL LIGHT HANDLE (MISCELLANEOUS) ×3 IMPLANT
COVER TRANSDUCER ULTRASND (DRAPES) ×3 IMPLANT
DEVICE TROCAR PUNCTURE CLOSURE (ENDOMECHANICALS) ×3 IMPLANT
DRAPE C-ARM 42X72 X-RAY (DRAPES) IMPLANT
DRAPE UTILITY 15X26 W/TAPE STR (DRAPE) ×6 IMPLANT
ELECT REM PT RETURN 9FT ADLT (ELECTROSURGICAL) ×3
ELECTRODE REM PT RTRN 9FT ADLT (ELECTROSURGICAL) ×1 IMPLANT
GAUZE SPONGE 2X2 8PLY STRL LF (GAUZE/BANDAGES/DRESSINGS) ×1 IMPLANT
GLOVE BIO SURGEON STRL SZ7.5 (GLOVE) ×3 IMPLANT
GLOVE BIOGEL M 6.5 STRL (GLOVE) ×6 IMPLANT
GLOVE BIOGEL PI IND STRL 7.0 (GLOVE) ×3 IMPLANT
GLOVE BIOGEL PI INDICATOR 7.0 (GLOVE) ×6
GLOVE SURG SS PI 6.5 STRL IVOR (GLOVE) ×6 IMPLANT
GOWN STRL REUS W/ TWL LRG LVL3 (GOWN DISPOSABLE) ×3 IMPLANT
GOWN STRL REUS W/ TWL XL LVL3 (GOWN DISPOSABLE) ×1 IMPLANT
GOWN STRL REUS W/TWL LRG LVL3 (GOWN DISPOSABLE) ×6
GOWN STRL REUS W/TWL XL LVL3 (GOWN DISPOSABLE) ×2
IV CATH 14GX2 1/4 (CATHETERS) IMPLANT
KIT BASIN OR (CUSTOM PROCEDURE TRAY) ×3 IMPLANT
KIT ROOM TURNOVER OR (KITS) ×3 IMPLANT
NEEDLE INSUFFLATION 14GA 120MM (NEEDLE) ×3 IMPLANT
NS IRRIG 1000ML POUR BTL (IV SOLUTION) ×3 IMPLANT
PAD ARMBOARD 7.5X6 YLW CONV (MISCELLANEOUS) ×6 IMPLANT
POUCH SPECIMEN RETRIEVAL 10MM (ENDOMECHANICALS) IMPLANT
SCISSORS LAP 5X35 DISP (ENDOMECHANICALS) ×3 IMPLANT
SET CHOLANGIOGRAPHY FRANKLIN (SET/KITS/TRAYS/PACK) IMPLANT
SET IRRIG TUBING LAPAROSCOPIC (IRRIGATION / IRRIGATOR) ×3 IMPLANT
SLEEVE ENDOPATH XCEL 5M (ENDOMECHANICALS) ×3 IMPLANT
SPECIMEN JAR SMALL (MISCELLANEOUS) ×3 IMPLANT
SPONGE GAUZE 2X2 STER 10/PKG (GAUZE/BANDAGES/DRESSINGS) ×2
STRIP CLOSURE SKIN 1/2X4 (GAUZE/BANDAGES/DRESSINGS) ×2 IMPLANT
SUT MNCRL AB 3-0 PS2 18 (SUTURE) ×3 IMPLANT
SUT MNCRL AB 4-0 PS2 18 (SUTURE) ×3 IMPLANT
TAPE CLOTH SURG 4X10 WHT LF (GAUZE/BANDAGES/DRESSINGS) ×3 IMPLANT
TOWEL OR 17X24 6PK STRL BLUE (TOWEL DISPOSABLE) ×3 IMPLANT
TOWEL OR 17X26 10 PK STRL BLUE (TOWEL DISPOSABLE) ×3 IMPLANT
TRAY LAPAROSCOPIC (CUSTOM PROCEDURE TRAY) ×3 IMPLANT
TROCAR XCEL NON-BLD 11X100MML (ENDOMECHANICALS) ×3 IMPLANT
TROCAR XCEL NON-BLD 5MMX100MML (ENDOMECHANICALS) ×3 IMPLANT

## 2014-05-22 NOTE — H&P (View-Only) (Signed)
Patient ID: Katrina Jones, female   DOB: 06/13/1986, 28 y.o.   MRN: [id number]  Chief Complaint  Patient presents with  . Abdominal Pain    HPI Katrina Jones is a 28 y.o. female.  The patient is a 28 year old female is referred by Dr. Elnoria HowardHung for an evaluation of right upper quadrant pain. The patient has had numerous workups to include ultrasound, CT scan, and HIDA scan which have been normal. The patient did state that when she had CCK injected with a HIDA scan she did have reproducible right upper quadrant pain. The patient states she's had nausea vomiting and pain over the last 8 months. States it's progressively gotten worse. At this time she states anything she eats or drinks right upper quadrant pain.  HPI  Past Medical History  Diagnosis Date  . Polycystic ovarian syndrome   . Asthma     Past Surgical History  Procedure Laterality Date  . Knee surgery    . Mirina removal      Family History  Problem Relation Age of Onset  . Cancer Father   . Cancer Maternal Aunt   . Cancer Paternal Grandfather   . Diabetes Paternal Grandfather   . Heart disease Paternal Grandfather     Social History History  Substance Use Topics  . Smoking status: Current Some Day Smoker -- 1.00 packs/day    Types: Cigarettes  . Smokeless tobacco: Never Used  . Alcohol Use: Yes     Comment: socially    Allergies  Allergen Reactions  . Lactose Intolerance (Gi) Other (See Comments)    Upset stomach  . Other Other (See Comments)    Mold and dust allergy    Current Outpatient Prescriptions  Medication Sig Dispense Refill  . ondansetron (ZOFRAN ODT) 4 MG disintegrating tablet 4mg  ODT q4 hours prn nausea/vomit  12 tablet  1  . PRESCRIPTION MEDICATION Inject 1 mL into the skin every other day. allery injection      . sulfamethoxazole-trimethoprim (BACTRIM DS) 800-160 MG per tablet Take 1 tablet by mouth 2 (two) times daily.       No current facility-administered medications for this visit.     Review of Systems Review of Systems  Constitutional: Negative.   HENT: Negative.   Respiratory: Negative.   Cardiovascular: Negative.   Gastrointestinal: Negative.   Neurological: Negative.   All other systems reviewed and are negative.   Blood pressure 118/70, pulse 58, temperature 97.4 F (36.3 C), height 4\' 9"  (1.448 m), weight 120 lb (54.432 kg), last menstrual period 04/19/2014.  Physical Exam Physical Exam  Constitutional: She is oriented to person, place, and time. She appears well-developed and well-nourished.  HENT:  Head: Normocephalic and atraumatic.  Eyes: Conjunctivae and EOM are normal. Pupils are equal, round, and reactive to light.  Neck: Normal range of motion. Neck supple.  Cardiovascular: Normal rate, regular rhythm and normal heart sounds.   Pulmonary/Chest: Effort normal and breath sounds normal.  Abdominal: Soft. Bowel sounds are normal. She exhibits no distension and no mass. There is no tenderness. There is no rebound and no guarding.  Musculoskeletal: Normal range of motion.  Neurological: She is alert and oriented to person, place, and time.  Skin: Skin is warm and dry.  Psychiatric: She has a normal mood and affect.    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     Plan    1. We'll proceed to the operating room for a  laparoscopic cholecystectomy 2.All risks and benefits were discussed with the patient to generally include: infection, bleeding, possible need for post op ERCP, damage to the bile ducts, and bile leak. Alternatives were offered and described.  All questions were answered and the patient voiced understanding of the procedure and wishes to proceed at this point with a laparoscopic cholecystectomy         Marigene Ehlers., Jed Limerick 05/17/2014, 11:34 AM

## 2014-05-22 NOTE — Anesthesia Procedure Notes (Signed)
Procedure Name: Intubation Date/Time: 05/22/2014 11:53 AM Performed by: Arlice ColtMANESS, Mahitha Hickling B Pre-anesthesia Checklist: Patient identified, Emergency Drugs available, Suction available, Patient being monitored and Timeout performed Patient Re-evaluated:Patient Re-evaluated prior to inductionOxygen Delivery Method: Circle system utilized Preoxygenation: Pre-oxygenation with 100% oxygen Intubation Type: IV induction Ventilation: Mask ventilation without difficulty Laryngoscope Size: Mac and 3 Grade View: Grade I Tube type: Oral Tube size: 7.5 mm Number of attempts: 1 Airway Equipment and Method: Stylet Placement Confirmation: ETT inserted through vocal cords under direct vision,  positive ETCO2 and breath sounds checked- equal and bilateral Secured at: 21 cm Tube secured with: Tape Dental Injury: Teeth and Oropharynx as per pre-operative assessment

## 2014-05-22 NOTE — Anesthesia Preprocedure Evaluation (Addendum)
Anesthesia Evaluation  Patient identified by MRN, date of birth, ID band Patient awake    Reviewed: Allergy & Precautions, H&P , NPO status , Patient's Chart, lab work & pertinent test results, reviewed documented beta blocker date and time   Airway Mallampati: II TM Distance: >3 FB Neck ROM: Full    Dental  (+) Teeth Intact   Pulmonary Current Smoker (denies every day smoker),  breath sounds clear to auscultation        Cardiovascular Rhythm:Regular Rate:Normal     Neuro/Psych    GI/Hepatic   Endo/Other    Renal/GU      Musculoskeletal   Abdominal (+)  Abdomen: soft.    Peds  Hematology   Anesthesia Other Findings   Reproductive/Obstetrics                       Anesthesia Physical Anesthesia Plan  ASA: I  Anesthesia Plan: General   Post-op Pain Management:    Induction: Intravenous  Airway Management Planned: Oral ETT  Additional Equipment:   Intra-op Plan:   Post-operative Plan: Extubation in OR  Informed Consent: I have reviewed the patients History and Physical, chart, labs and discussed the procedure including the risks, benefits and alternatives for the proposed anesthesia with the patient or authorized representative who has indicated his/her understanding and acceptance.     Plan Discussed with:   Anesthesia Plan Comments:         Anesthesia Quick Evaluation

## 2014-05-22 NOTE — Op Note (Signed)
05/22/2014  12:30 PM  PATIENT:  Katrina Jones  28 y.o. female  PRE-OPERATIVE DIAGNOSIS:  bilary dyskenesia  POST-OPERATIVE DIAGNOSIS:  bilary dyskenesia  PROCEDURE:  Procedure(s): LAPAROSCOPIC CHOLECYSTECTOMY (N/A)  SURGEON:  Surgeon(s) and Role:    * Axel FillerArmando Lisl Slingerland, MD - Primary  PHYSICIAN ASSISTANT:   ASSISTANTS: Norm SaltMary Stuckey, PA-student   ANESTHESIA:   local and general  EBL:  Total I/O In: 1000 [I.V.:1000] Out: -   BLOOD ADMINISTERED:none  DRAINS: none   LOCAL MEDICATIONS USED:  BUPIVICAINE   SPECIMEN:  Source of Specimen:  Gallbladder  DISPOSITION OF SPECIMEN:  PATHOLOGY  COUNTS:  YES  TOURNIQUET:  * No tourniquets in log *  DICTATION: .Dragon Dictation  Details of the procedure: The patient was taken to the operating and placed in the supine position with bilateral SCDs in place. A time out was called and all facts were verified. A pneumoperitoneum was obtained via A Veress needle technique to a pressure of 14mm of mercury. A 5mm trochar was then placed in the right upper quadrant under visualization, and there were no injuries to any abdominal organs. A 11 mm port was then placed in the umbilical region after infiltrating with local anesthesia under direct visualization. A second epigastric port was placed under direct visualization. The gallbladder was identified and retracted, the peritoneum was then sharply dissected from the gallbladder and this dissection was carried down to Calot's triangle. The cystic duct was identified and stripped away circumferentially and seen going into the gallbladder 360, the critical angle was obtained. It was noted to be very dilated and large. 2 clips were placed proximally one distally and the cystic duct transected. The cystic artery was identified and 2 clips placed proximally and one distally and transected. We then proceeded to remove the gallbladder off the hepatic fossa with Bovie cautery. A retrieval bag was then placed in  the abdomen and gallbladder placed in the bag. The hepatic fossa was then reexamined and hemostasis was achieved with Bovie cautery and was excellent at this portion of the case. The subhepatic fossa and perihepatic fossa was then irrigated until the effluent was clear. The 11 mm trocar fascia was reapproximated with the Endo Close #1 Vicryl x1. The pneumoperitoneum was evacuated and all trochars removed under direct visulalization. The skin was then closed with 4-0 Monocryl and the skin dressed with Steri-Strips, gauze, and tape. The patient was awaken from general anesthesia and taken to the recovery room in stable condition.  PLAN OF CARE: Discharge to home after PACU  PATIENT DISPOSITION:  PACU - hemodynamically stable.   Delay start of Pharmacological VTE agent (>24hrs) due to surgical blood loss or risk of bleeding: not applicable

## 2014-05-22 NOTE — Anesthesia Postprocedure Evaluation (Signed)
  Anesthesia Post-op Note  Patient: Katrina Jones  Procedure(s) Performed: Procedure(s): LAPAROSCOPIC CHOLECYSTECTOMY (N/A)  Patient Location: PACU  Anesthesia Type:General  Level of Consciousness: awake, alert  and oriented  Airway and Oxygen Therapy: Patient Spontanous Breathing and Patient connected to nasal cannula oxygen  Post-op Pain: none  Post-op Assessment: Post-op Vital signs reviewed, Patient's Cardiovascular Status Stable and Respiratory Function Stable  Post-op Vital Signs: Reviewed and stable  Last Vitals:  Filed Vitals:   05/22/14 1242  BP:   Pulse:   Temp: 36.6 C  Resp:     Complications: No apparent anesthesia complications

## 2014-05-22 NOTE — Discharge Instructions (Signed)
[facility] ______[facility] °LAPAROSCOPIC SURGERY: POST OP INSTRUCTIONS °Always review your discharge instruction sheet given to you by the facility where your surgery was performed. °IF YOU HAVE DISABILITY OR FAMILY LEAVE FORMS, YOU MUST BRING THEM TO THE OFFICE FOR PROCESSING.   °DO NOT GIVE THEM TO YOUR DOCTOR. ° °1. A prescription for pain medication may be given to you upon discharge.  Take your pain medication as prescribed, if needed.  If narcotic pain medicine is not needed, then you may take acetaminophen (Tylenol) or ibuprofen (Advil) as needed. °2. Take your usually prescribed medications unless otherwise directed. °3. If you need a refill on your pain medication, please contact your pharmacy.  They will contact our office to request authorization. Prescriptions will not be filled after 5pm or on week-ends. °4. You should follow a light diet the first few days after arrival home, such as soup and crackers, etc.  Be sure to include lots of fluids daily. °5. Most patients will experience some swelling and bruising in the area of the incisions.  Ice packs will help.  Swelling and bruising can take several days to resolve.  °6. It is common to experience some constipation if taking pain medication after surgery.  Increasing fluid intake and taking a stool softener (such as Colace) will usually help or prevent this problem from occurring.  A mild laxative (Milk of Magnesia or Miralax) should be taken according to package instructions if there are no bowel movements after 48 hours. °7. Unless discharge instructions indicate otherwise, you may remove your bandages 24-48 hours after surgery, and you may shower at that time.  You may have steri-strips (small skin tapes) in place directly over the incision.  These strips should be left on the skin for 7-10 days.  If your surgeon used skin glue on the incision, you may shower in 24 hours.  The glue will flake off over the next 2-3 weeks.  Any sutures or  staples will be removed at the office during your follow-up visit. °8. ACTIVITIES:  You may resume regular (light) daily activities beginning the next day--such as daily self-care, walking, climbing stairs--gradually increasing activities as tolerated.  You may have sexual intercourse when it is comfortable.  Refrain from any heavy lifting or straining until approved by your doctor. °a. You may drive when you are no longer taking prescription pain medication, you can comfortably wear a seatbelt, and you can safely maneuver your car and apply brakes. °b. RETURN TO WORK:  __________________________________________________________ °9. You should see your doctor in the office for a follow-up appointment approximately 2-3 weeks after your surgery.  Make sure that you call for this appointment within a day or two after you arrive home to insure a convenient appointment time. °10. OTHER INSTRUCTIONS: __________________________________________________________________________________________________________________________ __________________________________________________________________________________________________________________________ °WHEN TO CALL YOUR DOCTOR: °1. Fever over 101.0 °2. Inability to urinate °3. Continued bleeding from incision. °4. Increased pain, redness, or drainage from the incision. °5. Increasing abdominal pain ° °The clinic staff is available to answer your questions during regular business hours.  Please don’t hesitate to call and ask to speak to one of the nurses for clinical concerns.  If you have a medical emergency, go to the nearest emergency room or call 911.  A surgeon from [facility] is always on call at the hospital. °[address], [address], [place], [place]  [address] ? [address] [address], [place], [place]   [address] °[phone] ? [phone] ? FAX [phone] °Web site:   [email or web address] ° °General Anesthesia, Adult, Care After  °Refer to this  sheet in the next few weeks. These instructions provide you with information on caring for yourself after your procedure. Your health care provider may also give you more specific instructions. Your treatment has been planned according to current medical practices, but problems sometimes occur. Call your health care provider if you have any problems or questions after your procedure.  °WHAT TO EXPECT AFTER THE PROCEDURE  °After the procedure, it is typical to experience:  °Sleepiness.  °Nausea and vomiting. °HOME CARE INSTRUCTIONS  °For the first 24 hours after general anesthesia:  °Have a responsible person with you.  °Do not drive a car. If you are alone, do not take public transportation.  °Do not drink alcohol.  °Do not take medicine that has not been prescribed by your health care provider.  °Do not sign important papers or make important decisions.  °You may resume a normal diet and activities as directed by your health care provider.  °Change bandages (dressings) as directed.  °If you have questions or problems that seem related to general anesthesia, call the hospital and ask for the anesthetist or anesthesiologist on call. °SEEK MEDICAL CARE IF:  °You have nausea and vomiting that continue the day after anesthesia.  °You develop a rash. °SEEK IMMEDIATE MEDICAL CARE IF:  °You have difficulty breathing.  °You have chest pain.  °You have any allergic problems. °Document Released: 01/04/2001 Document Revised: 05/31/2013 Document Reviewed: 04/13/2013  °Exit[employer]® Patient Information ©2014 [employer].  ° ° °

## 2014-05-22 NOTE — Interval H&P Note (Signed)
History and Physical Interval Note:  05/22/2014 7:14 AM  Katrina Jones  has presented today for surgery, with the diagnosis of bilary dyskenesia  The various methods of treatment have been discussed with the patient and family. After consideration of risks, benefits and other options for treatment, the patient has consented to  Procedure(s): LAPAROSCOPIC CHOLECYSTECTOMY (N/A) as a surgical intervention .  The patient's history has been reviewed, patient examined, no change in status, stable for surgery.  I have reviewed the patient's chart and labs.  Questions were answered to the patient's satisfaction.     Marigene Ehlersamirez Jr., Jed LimerickArmando

## 2014-05-22 NOTE — Transfer of Care (Signed)
Immediate Anesthesia Transfer of Care Note  Patient: Katrina Jones  Procedure(s) Performed: Procedure(s): LAPAROSCOPIC CHOLECYSTECTOMY (N/A)  Patient Location: PACU  Anesthesia Type:General  Level of Consciousness: awake and alert   Airway & Oxygen Therapy: Patient Spontanous Breathing  Post-op Assessment: Report given to PACU RN and Post -op Vital signs reviewed and stable  Post vital signs: Reviewed and stable  Complications: No apparent anesthesia complications

## 2014-05-23 ENCOUNTER — Encounter (HOSPITAL_COMMUNITY): Payer: Self-pay | Admitting: General Surgery

## 2014-05-28 ENCOUNTER — Telehealth (INDEPENDENT_AMBULATORY_CARE_PROVIDER_SITE_OTHER): Payer: Self-pay

## 2014-05-28 NOTE — Telephone Encounter (Signed)
Pt s/p lap chole on 05/22/14 by Dr Derrell Lollingamirez. Pt states that she has still been having some pain and would like to get a refill on her Oxycodone 5/325. Pt rates her pain a 7 out of 10 at this time. Pt states that she has also been taking Ibuprofen with some relief as well as placing a ice pack on the area. Pt denies any fevers, chills, n/v, and no issues with her bowels. Informed pt that Dr Derrell Lollingamirez is out of the office and I will send this to one of his partners. We will contact the pt as soon as we receive a response. Pt verbalized understanding and agrees with POC.

## 2014-05-29 ENCOUNTER — Other Ambulatory Visit (INDEPENDENT_AMBULATORY_CARE_PROVIDER_SITE_OTHER): Payer: Self-pay

## 2014-05-29 DIAGNOSIS — G8918 Other acute postprocedural pain: Secondary | ICD-10-CM

## 2014-05-29 MED ORDER — OXYCODONE-ACETAMINOPHEN 5-325 MG PO TABS: 1.0000 | ORAL_TABLET | ORAL | Status: DC | PRN

## 2014-06-14 ENCOUNTER — Encounter (HOSPITAL_COMMUNITY): Payer: Self-pay | Admitting: Emergency Medicine

## 2014-06-14 ENCOUNTER — Emergency Department (HOSPITAL_COMMUNITY)
Admission: EM | Admit: 2014-06-14 | Discharge: 2014-06-14 | Disposition: A | Discharge status: Home / Self Care | Payer: Medicaid Other | Attending: Emergency Medicine | Admitting: Emergency Medicine

## 2014-06-14 DIAGNOSIS — Z79899 Other long term (current) drug therapy: Secondary | ICD-10-CM | POA: Insufficient documentation

## 2014-06-14 DIAGNOSIS — M129 Arthropathy, unspecified: Secondary | ICD-10-CM | POA: Insufficient documentation

## 2014-06-14 DIAGNOSIS — R1031 Right lower quadrant pain: Secondary | ICD-10-CM | POA: Insufficient documentation

## 2014-06-14 DIAGNOSIS — E282 Polycystic ovarian syndrome: Secondary | ICD-10-CM | POA: Diagnosis not present

## 2014-06-14 DIAGNOSIS — Z3202 Encounter for pregnancy test, result negative: Secondary | ICD-10-CM | POA: Diagnosis not present

## 2014-06-14 DIAGNOSIS — J45909 Unspecified asthma, uncomplicated: Secondary | ICD-10-CM | POA: Insufficient documentation

## 2014-06-14 DIAGNOSIS — F17200 Nicotine dependence, unspecified, uncomplicated: Secondary | ICD-10-CM | POA: Insufficient documentation

## 2014-06-14 DIAGNOSIS — M549 Dorsalgia, unspecified: Secondary | ICD-10-CM | POA: Diagnosis present

## 2014-06-14 DIAGNOSIS — Z8659 Personal history of other mental and behavioral disorders: Secondary | ICD-10-CM | POA: Diagnosis not present

## 2014-06-14 LAB — URINALYSIS, ROUTINE W REFLEX MICROSCOPIC
BILIRUBIN URINE: NEGATIVE
GLUCOSE, UA: NEGATIVE mg/dL
HGB URINE DIPSTICK: NEGATIVE
Ketones, ur: NEGATIVE mg/dL
Leukocytes, UA: NEGATIVE
Nitrite: NEGATIVE
Protein, ur: NEGATIVE mg/dL
SPECIFIC GRAVITY, URINE: 1.008 (ref 1.005–1.030)
Urobilinogen, UA: 0.2 mg/dL (ref 0.0–1.0)
pH: 7 (ref 5.0–8.0)

## 2014-06-14 LAB — WET PREP, GENITAL
CLUE CELLS WET PREP: NONE SEEN
TRICH WET PREP: NONE SEEN
WBC, Wet Prep HPF POC: NONE SEEN
Yeast Wet Prep HPF POC: NONE SEEN

## 2014-06-14 LAB — POC URINE PREG, ED: PREG TEST UR: NEGATIVE

## 2014-06-14 MED ORDER — OXYCODONE-ACETAMINOPHEN 5-325 MG PO TABS
1.0000 | ORAL_TABLET | Freq: Once | ORAL | Status: AC
  Administered 2014-06-14: 1 via ORAL
  Filled 2014-06-14: qty 1

## 2014-06-14 NOTE — ED Notes (Signed)
Per pt, lower back pain across complete lower back.  Pt states goes into buttocks.  No urinary changes although admits to some pain with intercourse.  Pain there for about a week.  Pt states it makes her feel like she needs to have a BM but she doesn't.  Denies trauma.  Denies hx of same.  Has had hx of UTI.  Pt states she has family with ovarian cancer and she is worried about it.  States family told her that the symptoms were the same as hers.

## 2014-06-14 NOTE — ED Provider Notes (Signed)
CSN: [id number]     Arrival date & time 06/14/14  0915 History   First MD Initiated Contact with Patient 06/14/14 1001     Chief Complaint  Patient presents with  . Back Pain     (Consider location/radiation/quality/duration/timing/severity/associated sxs/prior Treatment) Patient is a 28 y.o. female presenting with abdominal pain.  Abdominal Pain Pain location:  RLQ Pain quality: aching and sharp   Pain radiates to:  Groin and back Pain severity:  Severe Onset quality:  Gradual Duration: 6 months with acute worsening over last 3 days. Timing:  Constant Progression:  Worsening Chronicity:  Chronic Context comment:  Known PCOS and R adnexal cyst Relieved by:  Not moving Worsened by:  Palpation and movement Ineffective treatments:  None tried Associated symptoms: no anorexia, no chest pain, no chills, no constipation, no cough, no diarrhea, no dysuria, no fever, no nausea, no shortness of breath, no sore throat, no vaginal bleeding, no vaginal discharge and no vomiting     Past Medical History  Diagnosis Date  . Polycystic ovarian syndrome   . Asthma     exersise induced  . Anxiety     Panic Attacks  . Arthritis     left knee   Past Surgical History  Procedure Laterality Date  . Knee surgery Left     ac tear   . Mirina removal    . Tubal ligation    . Cesarean section      x 2  . Tonsillectomy      and Adnoidectomy  . Cholecystectomy N/A 05/22/2014    Procedure: LAPAROSCOPIC CHOLECYSTECTOMY;  Surgeon: Axel Filler, MD;  Location: [facility] OR;  Service: General;  Laterality: N/A;   Family History  Problem Relation Age of Onset  . Cancer Father   . Cancer Maternal Aunt   . Cancer Paternal Grandfather   . Diabetes Paternal Grandfather   . Heart disease Paternal Grandfather    History  Substance Use Topics  . Smoking status: Current Some Day Smoker -- 0 years    Types: Cigarettes  . Smokeless tobacco: Never Used  . Alcohol Use: 3.6 oz/week    6 Cans of beer per  week     Comment: socially   OB History   Grav Para Term Preterm Abortions TAB SAB Ect Mult Living   Review of Systems  Constitutional: Negative for fever and chills.  HENT: Negative for congestion, rhinorrhea and sore throat.   Eyes: Negative for photophobia and visual disturbance.  Respiratory: Negative for cough and shortness of breath.   Cardiovascular: Negative for chest pain and leg swelling.  Gastrointestinal: Positive for abdominal pain. Negative for nausea, vomiting, diarrhea, constipation and anorexia.  Endocrine: Negative for polyphagia and polyuria.  Genitourinary: Negative for dysuria, flank pain, vaginal bleeding, vaginal discharge and enuresis.  Musculoskeletal: Negative for back pain and gait problem.  Skin: Negative for color change and rash.  Neurological: Negative for dizziness, syncope, light-headedness and numbness.  Hematological: Negative for adenopathy. Does not bruise/bleed easily.  All other systems reviewed and are negative.     Allergies  Lactose intolerance (gi) and Other  Home Medications   Prior to Admission medications   Medication Sig Start Date End Date Taking? Authorizing Provider  omeprazole (PRILOSEC) 40 MG capsule Take 40 mg by mouth daily.    Historical Provider, MD  ondansetron (ZOFRAN-ODT) 4 MG disintegrating tablet Take 4 mg by mouth every 4 (four)  hours as needed for nausea or vomiting.    Historical Provider, MD  oxyCODONE-acetaminophen (PERCOCET/ROXICET) 5-325 MG per tablet Take 1-2 tablets by mouth every 4 (four) hours as needed for severe pain.    Historical Provider, MD   BP 129/82  Pulse 82  Temp(Src) 98.7 F (37.1 C) (Oral)  Resp 20  SpO2 97%  LMP 05/14/2014 Physical Exam  Vitals reviewed. Constitutional: She is oriented to person, place, and time. She appears well-developed and well-nourished.  HENT:  Head: Normocephalic and atraumatic.  Right Ear: External ear normal.  Left Ear: External ear  normal.  Eyes: Conjunctivae and EOM are normal. Pupils are equal, round, and reactive to light.  Neck: Normal range of motion. Neck supple.  Cardiovascular: Normal rate, regular rhythm, normal heart sounds and intact distal pulses.   Pulmonary/Chest: Effort normal and breath sounds normal.  Abdominal: Soft. Bowel sounds are normal. There is tenderness in the right lower quadrant.  Musculoskeletal: Normal range of motion.  Neurological: She is alert and oriented to person, place, and time.  Skin: Skin is warm and dry.    ED Course  Procedures (including critical care time) Labs Review Labs Reviewed  URINALYSIS, ROUTINE W REFLEX MICROSCOPIC - Abnormal; Notable for the following:    APPearance CLOUDY (*)    All other components within normal limits  GC/CHLAMYDIA PROBE AMP  WET PREP, GENITAL  RPR  HIV ANTIBODY (ROUTINE TESTING)  POC URINE PREG, ED    Imaging Review No results found.   EKG Interpretation None      MDM   Final diagnoses:  PCOS (polycystic ovarian syndrome)  RLQ abdominal pain    28 y.o. female  with pertinent PMH of PCOS, known R ovarian cyst presents with acute on chronic  right lower quadrant pain x3 days.  No trauma, fevers, GI, or other infectious symptoms. Patient states that her pain radiates into her back it feels similar to known ovarian pathology. She has had the lysis of adhesions of her ovaries in the past. Physical exam as above with significant right adnexal tenderness.  No CMT, concerning discharge, or other findings for PID.  As she has known pathology which has been gradually progressive 4 months, no concerning physical exam findings at this time, a negative pregnancy test, do not feel that ultrasound is indicated as the patient is at very low risk for tubo-ovarian abscess, pelvic inflammatory disease, and symptoms are constant meaning ovarian torsion is unlikely. We'll have her followup [facility] about her likely cystic  pain.    Labs and imaging as above reviewed.   1. PCOS (polycystic ovarian syndrome)   2. RLQ abdominal pain         Mirian Mo, MD 06/14/14 1123

## 2014-06-14 NOTE — Discharge Instructions (Signed)
Abdominal Pain °Many things can cause abdominal pain. Usually, abdominal pain is not caused by a disease and will improve without treatment. It can often be observed and treated at home. Your health care provider will do a physical exam and possibly order blood tests and X-rays to help determine the seriousness of your pain. However, in many cases, more time must pass before a clear cause of the pain can be found. Before that point, your health care provider may not know if you need more testing or further treatment. °HOME CARE INSTRUCTIONS  °Monitor your abdominal pain for any changes. The following actions may help to alleviate any discomfort you are experiencing: °· Only take over-the-counter or prescription medicines as directed by your health care provider. °· Do not take laxatives unless directed to do so by your health care provider. °· Try a clear liquid diet (broth, tea, or water) as directed by your health care provider. Slowly move to a bland diet as tolerated. °SEEK MEDICAL CARE IF: °· You have unexplained abdominal pain. °· You have abdominal pain associated with nausea or diarrhea. °· You have pain when you urinate or have a bowel movement. °· You experience abdominal pain that wakes you in the night. °· You have abdominal pain that is worsened or improved by eating food. °· You have abdominal pain that is worsened with eating fatty foods. °· You have a fever. °SEEK IMMEDIATE MEDICAL CARE IF:  °· Your pain does not go away within 2 hours. °· You keep throwing up (vomiting). °· Your pain is felt only in portions of the abdomen, such as the right side or the left lower portion of the abdomen. °· You pass bloody or black tarry stools. °MAKE SURE YOU: °· Understand these instructions.   °· Will watch your condition.   °· Will get help right away if you are not doing well or get worse.   °Document Released: 07/08/2005 Document Revised: 10/03/2013 Document Reviewed: 06/07/2013 °ExitCare® Patient Information  ©2015 [employer]. This information is not intended to replace advice given to you by your health care provider. Make sure you discuss any questions you have with your health care provider. ° ° °Emergency Department Resource Guide °1) Find a Doctor and Pay Out of Pocket °Although you won't have to find out who is covered by your insurance plan, it is a good idea to ask around and get recommendations. You will then need to call the office and see if the doctor you have chosen will accept you as a new patient and what types of options they offer for patients who are self-pay. Some doctors offer discounts or will set up payment plans for their patients who do not have insurance, but you will need to ask so you aren't surprised when you get to your appointment. ° °2) Contact Your Local Health Department °Not all health departments have doctors that can see patients for sick visits, but many do, so it is worth a call to see if yours does. If you don't know where your local health department is, you can check in your phone book. The CDC also has a tool to help you locate your state's health department, and many state websites also have listings of all of their local health departments. ° °3) Find a Walk-in Clinic °If your illness is not likely to be very severe or complicated, you may want to try a walk in clinic. These are popping up all over the country in pharmacies, drugstores, and shopping centers. They're   usually staffed by nurse practitioners or physician assistants that have been trained to treat common illnesses and complaints. They're usually fairly quick and inexpensive. However, if you have serious medical issues or chronic medical problems, these are probably not your best option. ° °No Primary Care Doctor: °- Call [facility] at  832-8000 - they can help you locate a primary care doctor that  accepts your insurance, provides certain services, etc. °- Physician Referral Service- 1-800-533-3463 ° °Chronic  Pain Problems: °Organization         Address  Phone   Notes  °[facility]  (336) 297-2271 Patients need to be referred by their primary care doctor.  ° °Medication Assistance: °Organization         Address  Phone   Notes  °[place] [facility] [address] °[place], [place] [address] (336) 641-8030 --Must be a resident of [place] °-- Must have NO insurance coverage whatsoever (no Medicaid/ Medicare, etc.) °-- The pt. MUST have a primary care doctor that directs their care regularly and follows them in the community °  °[facility]  (866) 331-1348   °[employer]  (888) 892-1162   ° °Agencies that provide inexpensive medical care: °Organization         Address  Phone   Notes  °[facility]  (336) 832-8035   °[facility]    (336) 832-7272   °[facility] [address] °[place], [place] [address] (336) 832-4777   °[facility] [address], °[place] (336) 271-4999   °[facility]    (336) 373-0678   °[facility]    (336) 272-1050   °[facility] ° [address], [place] Phone:  (336) 832-4444, Fax:  (336) 832-4440 Hours of Operation:  9 am - 6 pm, M-F.  Also accepts Medicaid/Medicare and self-pay.  °[facility] ° [address], [place] Phone: (336) 832-3150, Fax: (336) 832-3151. Hours of Operation:  8:30 am - 5:30 pm, M-F.  Also accepts Medicaid and self-pay.  °[facility] [place] [address], [place] Phone: (336) 878-6027   °[facility] [address], [place], [place] (336)723-1848, Ext. 123 Mondays & Thursdays: 7-9 AM.  First 15 patients are seen on a first come, first serve basis. °  ° °Medicaid-accepting [place] Providers: ° °Organization         Address  Phone   Notes  °[facility] [address], [place] (336) 641-2100 Also  accepts self-pay patients.  °[facility] [address], [place] ° (336) 856-9996   °[facility] [address], [place] (336) 288-8857   °[facility] [address] [place] [address], [place] (336) 299-7000   °Veita Bland [address], [place]  ° (336) 373-1557 Only accepts [place] Access Medicaid patients after they have their name applied to their card.  ° °Self-Pay (no insurance) in [place]: ° °Organization         Address  Phone   Notes  °Sickle Cell Patients, [facility] [address], [place] (336) 832-1970   °[facility] [address],  (336) 832-4400   °[facility] ° [address], [place] (336) 992-4800   °[facility]/Dr. Osei-Bonsu ° [address] [place] [address], [place] or [address], [place] (336) [phone] Phone number for both [place] and [place] locations is the same.  °[facility] [address], [place] (336) 299-0000   °[facility] [address] [place] [address], [place] or [address] (336) 852-7530 °(336) 878-2260   °[facility] [address], [place] (336) 350-1642, phone; (336) 294-5005, fax Sees patients 1st and 3rd Saturday of every month.  Must not qualify for public or private insurance (i.e. Medicaid, Medicare, [place] Health Choice, Veterans' Benefits) • Household income should be no more than 200% of the poverty level •The clinic cannot treat you if you are pregnant or think you are pregnant • Sexually transmitted diseases are not treated at the clinic.  ° ° °Dental Care: °Organization         Address  Phone  Notes  °[facility] [facility] [address], [place] (336) 641-6152 Accepts children up to age 21 who are enrolled in Medicaid or [place] Health Choice; pregnant  women with a Medicaid card; and children who have applied for Medicaid or [place] Health Choice, but were declined, whose parents can pay a reduced fee at time of service.  °[facility] [place]  [address], [place] (336) 641-7733 Accepts children up to age 21 who are enrolled in Medicaid or [place] Health Choice; pregnant women with a Medicaid card; and children who have applied for Medicaid or [place] Health Choice, but were declined, whose parents can pay a reduced fee at time of service.  °[facility] ° [address], [place] (336) 641-4533 Patients are seen by appointment only. Walk-ins are not accepted. [facility] will see patients 18 years of age and older. °Monday - Tuesday (8am-5pm) °Most Wednesdays (8:30-5pm) °$30 per visit, cash only  °[facility] ° [address], [place] (336) 641-4533 Patients are seen by appointment only. Walk-ins are not accepted. [facility] will see patients 18 years of age and older. °One Wednesday Evening (Monthly: Volunteer Based).  $30 per visit, cash only  °[facility]  (919) 537-3737 for adults; Children under age 4, call Graduate Pediatric Dentistry at (919) [phone]. Children aged 4-14, please call (919) [phone] to request a pediatric application. ° Dental services are provided in all areas of dental care including fillings, crowns and bridges, complete and partial dentures, implants, gum treatment, root canals, and extractions. Preventive care is also provided. Treatment is provided to both adults and children. °Patients are selected via a lottery and there is often a waiting list. °  °[facility] [address], °[place] ° (336) 763-8833 [email or web address] °  °[facility] [address], [place], [place] (336)723-1848, Ext. 123 Second and Fourth Thursday of each month, opens at 6:30 AM; Clinic ends at 9 AM.  Patients are  seen on a first-come first-served basis, and a limited number are seen during each clinic.  ° °[facility] ° [address], [place], [place] (336) 723-7904   Eligibility Requirements °You must have lived in [place], [place], or [place] counties for at least the last three months. °  You cannot be eligible for state or federal sponsored healthcare insurance, including Veterans Administration, Medicaid, or Medicare. °  You generally cannot be eligible for healthcare insurance through   your employer.  °  How to apply: °Eligibility screenings are held every Tuesday and Wednesday afternoon from 1:00 pm until 4:00 pm. You do not need an appointment for the interview!  °[facility] [address], [place], [place] 336-631-2330   °[facility]  336-342-8273   °[facility]  336-703-3100   °[facility]  336-570-6415   ° °Behavioral Health Resources in the Community: °Intensive Outpatient Programs °Organization         Address  Phone  Notes  °[facility] [address], [place], [place] 336-878-6098   °[facility] [address], [place], [place] 336-832-9800   °[facility] [address], [place], [place] ° 336-882-2125   °[facility] [address],  °[place], [place] 1-800-853-5163 or 336-641-4981   °Substance Abuse Resources °Organization         Address  Phone  Notes  °[facility]  336-882-2125   °[facility]  336-784-9470   °[facility]  336-285-9073   °[facility]  336-845-3988   °Residential & Outpatient Substance Abuse Program  1-800-659-3381   °Psychological Services °Organization         Address  Phone  Notes  °[facility]  336- 832-9600   °[facility]  336- 378-7881   °[facility] [address], [place] 1-800-853-5163 or 336-641-4981   ° °Mobile Crisis  Teams °Organization         Address  Phone  Notes  °[facility], Mobile Crisis Care Unit  1-877-626-1772   °Assertive °[facility] ° [address] [place], [place] 336-834-9664   °Sharon DeEsch [address] °[place] 336-554-5454   ° °Self-Help/Support Groups °Organization         Address  Phone             Notes  °[facility] [place] - variety of support groups  336- 373-1402 Call for more information  °Narcotics Anonymous (NA), [facility] [address], °[place]  2 meetings at this location  ° °Residential Treatment Programs °Organization         Address  Phone  Notes  °[facility] [address],    °[place]  1-866-801-8205   °[facility] ° [address], [place], [place] 704-293-8524   °[facility] [address], [place] 336-845-3988 Admissions: 8am-3pm M-F  °[facility] [address]    °[place], [place] 336-841-1104   °[facility] [address], [place], [place] 336-379-7146   °[facility] [address]  °[place], [place] 336-285-9073   °[facility] Intensive Outpatient [address], [place], [place] 336-852-3033   °[facility] [address]  °[place], [place] 1-877-615-2722 or 336-784-9470   °[facility] [address] [place], [place] 336-227-7417 Accepts Medicaid  °[facility] [address]  ° [place] 1-800-659-3381 Substance Abuse/Addiction Treatment  ° °[place] Behavioral Health Resources °Organization         Address  Phone  Notes  °[facility]  (888) 581-9988   °Julie Brannon, PhD [address] [place],    (336) 349-5553 or (336) 951-0000   °[facility]   [address] °,  (336) 349-4454   °  [facility] [address], [place], [place] (336) 342-8316  Insurance/Medicaid/sponsorship through [facility]  °[facility] [address]                                    [place], [place] (336) 342-8316 Therapy/tele-psych/case  °[facility] [address]  ° [place], [place] (336) 349-2233    °Dr. Arfeen  (336) 349-4544   °[facility] [place]  [employer] [place] [facility] 1) [address], [place] °2) [address], [place] °3)  [address], [place] (336) 349-3220 °(336) 342-7768 ° °(336) 342-8140   °[place] [facility] (336) 342-1394 or (336) 342-3537 (After Hours)    ° ° ° ° °

## 2014-06-15 LAB — GC/CHLAMYDIA PROBE AMP
CT PROBE, AMP APTIMA: NEGATIVE
GC Probe RNA: NEGATIVE

## 2014-08-13 ENCOUNTER — Encounter (HOSPITAL_COMMUNITY): Payer: Self-pay | Admitting: Emergency Medicine

## 2014-09-12 DIAGNOSIS — R102 Pelvic and perineal pain: Secondary | ICD-10-CM | POA: Insufficient documentation

## 2014-09-12 DIAGNOSIS — N809 Endometriosis, unspecified: Secondary | ICD-10-CM | POA: Insufficient documentation

## 2014-10-08 ENCOUNTER — Encounter: Payer: Self-pay | Admitting: *Deleted

## 2014-10-09 ENCOUNTER — Encounter: Payer: Self-pay | Admitting: Obstetrics & Gynecology

## 2015-04-01 ENCOUNTER — Other Ambulatory Visit: Payer: Self-pay | Admitting: Orthopedic Surgery

## 2015-04-01 ENCOUNTER — Other Ambulatory Visit: Payer: Self-pay | Admitting: Physical Medicine and Rehabilitation

## 2015-04-01 ENCOUNTER — Ambulatory Visit
Admission: RE | Admit: 2015-04-01 | Discharge: 2015-04-01 | Disposition: A | Discharge status: Home / Self Care | Payer: Medicaid Other | Source: Ambulatory Visit | Attending: Orthopedic Surgery | Admitting: Orthopedic Surgery

## 2015-04-01 DIAGNOSIS — G8929 Other chronic pain: Secondary | ICD-10-CM

## 2015-04-01 DIAGNOSIS — M545 Low back pain, unspecified: Secondary | ICD-10-CM

## 2015-04-01 DIAGNOSIS — M5416 Radiculopathy, lumbar region: Secondary | ICD-10-CM

## 2015-04-01 MED ORDER — METHYLPREDNISOLONE ACETATE 40 MG/ML INJ SUSP (RADIOLOG
120.0000 mg | Freq: Once | INTRAMUSCULAR | Status: AC
  Administered 2015-04-01: 120 mg via EPIDURAL

## 2015-04-01 MED ORDER — IOHEXOL 180 MG/ML  SOLN
1.0000 mL | Freq: Once | INTRAMUSCULAR | Status: AC | PRN
  Administered 2015-04-01: 1 mL via EPIDURAL

## 2015-04-01 NOTE — Discharge Instructions (Signed)

## 2015-05-06 ENCOUNTER — Other Ambulatory Visit: Payer: Self-pay | Admitting: Orthopedic Surgery

## 2015-05-06 DIAGNOSIS — M533 Sacrococcygeal disorders, not elsewhere classified: Principal | ICD-10-CM

## 2015-05-06 DIAGNOSIS — G8929 Other chronic pain: Secondary | ICD-10-CM

## 2015-05-09 ENCOUNTER — Ambulatory Visit
Admission: RE | Admit: 2015-05-09 | Discharge: 2015-05-09 | Disposition: A | Discharge status: Home / Self Care | Payer: Medicaid Other | Source: Ambulatory Visit | Attending: Orthopedic Surgery | Admitting: Orthopedic Surgery

## 2015-05-09 ENCOUNTER — Other Ambulatory Visit: Payer: Self-pay | Admitting: Orthopedic Surgery

## 2015-05-09 DIAGNOSIS — G8929 Other chronic pain: Secondary | ICD-10-CM

## 2015-05-09 DIAGNOSIS — M533 Sacrococcygeal disorders, not elsewhere classified: Principal | ICD-10-CM

## 2015-05-17 ENCOUNTER — Telehealth: Payer: Self-pay | Admitting: Vascular Surgery

## 2015-05-17 NOTE — Telephone Encounter (Signed)
Spoke with pt re appt, dpm  °

## 2015-05-17 NOTE — Telephone Encounter (Signed)
-----   Message from Phillips Odor, RN sent at 05/17/2015 11:08 AM EDT ----- Regarding: RE: needs new pt. consult with T[facility] Contact: [phone] I spoke with Dr. Arbie Cookey re: his schedule.  He gave the approval to add the ALIF pt. @ 12:30 PM on 05/21/15, "so I can hit the ground running at 1:00 PM."  Thanks.   ----- Message -----    From: Fredrich Birks    Sent: 05/17/2015  10:18 AM      To: Smitty Pluck Pullins, RN Subject: RE: needs new pt. consult with TFE             Dr Arbie Cookey is only here 1/2 day on the 9th is already overbooked.  Could we put this on a Thursday or his extra laser day?   Annabelle Harman ----- Message -----    From: Phillips Odor, RN    Sent: 05/16/2015  12:01 PM      To: [facility] Admin Pool Subject: needs new pt. consult with T[facility]                 Please schedule for new patient consult by 05/28/15; having ALIF 06/05/15/ Dr. Arbie Cookey is assisting Dr. Yevette Edwards.  Please remind pt. to bring LS spine films to office appt.

## 2015-05-20 ENCOUNTER — Other Ambulatory Visit: Payer: Self-pay | Admitting: Orthopedic Surgery

## 2015-05-20 ENCOUNTER — Encounter: Payer: Self-pay | Admitting: Vascular Surgery

## 2015-05-21 ENCOUNTER — Encounter: Payer: Self-pay | Admitting: Vascular Surgery

## 2015-05-21 ENCOUNTER — Ambulatory Visit (INDEPENDENT_AMBULATORY_CARE_PROVIDER_SITE_OTHER): Payer: Medicaid Other | Admitting: Vascular Surgery

## 2015-05-21 VITALS — BP 112/65 | HR 90 | Temp 98.1°F | Resp 18 | Ht <= 58 in | Wt 118.3 lb

## 2015-05-21 DIAGNOSIS — M5136 Other intervertebral disc degeneration, lumbar region: Secondary | ICD-10-CM

## 2015-05-21 NOTE — Progress Notes (Signed)
Patient name: Katrina Jones MRN: [id number] DOB: 09-26-1986 Sex: female   Referred by: Yevette Edwards  Reason for referral:  Chief Complaint  Patient presents with  . 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 She reports that she has had some back pain for many years but over the past 6 months [place] become markedly progressive. She is now having numbness and tingling down both legs more so on her right than her left. So difficult for her to bend over and do her activities of daily living. She has seen Dr.Dumonski who has recommended L5-S1 disc surgery from an anterior approach. I am seeing her today for a role for exposure. She has had prior abdominal surgery. She has had the ovarian surgery and also hysterectomy and cesarean sections. She has had the laparoscopic cholecystectomy as well.  Past Medical History  Diagnosis Date  . Polycystic ovarian syndrome   . Asthma     exersise induced  . Anxiety     Panic Attacks  . Arthritis     left knee  . Allergy   . Atrial fibrillation     Past Surgical History  Procedure Laterality Date  . Knee surgery Left     ac tear   . Mirina removal    . Tubal ligation    . Cesarean section      x 2  . Tonsillectomy      and Adnoidectomy  . Cholecystectomy N/A 05/22/2014    Procedure: LAPAROSCOPIC CHOLECYSTECTOMY;  Surgeon: Axel Filler, MD;  Location: [facility] OR;  Service: General;  Laterality: N/A;    History   Social History  . Marital Status: Legally Separated    Spouse Name: N/A  . Number of Children: N/A  . Years of Education: N/A   Occupational History  . Not on file.   Social History Main Topics  . Smoking status: Former Smoker -- 0 years    Types: Cigarettes    Quit date: 06/12/2012  . Smokeless tobacco: Never Used  . Alcohol Use: 3.6 oz/week    6 Cans of beer per  week     Comment: socially  . Drug Use: No  . Sexual Activity:    Partners: Female    Copy: Surgical     Comment: Vap 1 mg Nicotene   Other Topics Concern  . Not on file   Social History Narrative    Family History  Problem Relation Age of Onset  . Cancer Father   . Cancer Maternal Aunt   . Cancer Paternal Grandfather   . Diabetes Paternal Grandfather   . Heart disease Paternal Grandfather     Allergies as of 05/21/2015 - Review Complete 05/21/2015  Allergen Reaction Noted  . Lactose intolerance (gi) Other (See Comments) 01/22/2014  . Other Other (See Comments) 04/29/2014    Current Outpatient Prescriptions on File Prior to Visit  Medication Sig Dispense Refill  . ondansetron (ZOFRAN-ODT) 4 MG disintegrating tablet Take 4 mg by mouth every 4 (four) hours as needed for nausea or vomiting.    Marland Kitchen omeprazole (PRILOSEC) 40 MG capsule Take 40 mg by mouth daily.    Marland Kitchen oxyCODONE-acetaminophen (PERCOCET/ROXICET) 5-325 MG per tablet Take 1-2 tablets by mouth every 4 (four) hours as needed for severe pain.     No current facility-administered medications  on file prior to visit.     REVIEW OF SYSTEMS:  Positives indicated with an "X"  CARDIOVASCULAR:  [ ]  chest pain   [ ]  chest pressure   [ ]  palpitations   [ ]  orthopnea   [ ]  dyspnea on exertion   [ ]  claudication   [ ]  rest pain   [ ]  DVT   [ ]  phlebitis PULMONARY:   [ ]  productive cough   [ ]  asthma   [ ]  wheezing NEUROLOGIC:   [x ] weakness  [x ] paresthesias  [ ]  aphasia  [ ]  amaurosis  [ ]  dizziness HEMATOLOGIC:   [ ]  bleeding problems   [ ]  clotting disorders MUSCULOSKELETAL:  [ ]  joint pain   [ ]  joint swelling GASTROINTESTINAL: [ ]   blood in stool  [ ]   hematemesis GENITOURINARY:  [ ]   dysuria  [ ]   hematuria PSYCHIATRIC:  [ ]  history of major depression INTEGUMENTARY:  [ ]  rashes  [ ]  ulcers CONSTITUTIONAL:  [ ]  fever   [ ]  chills  PHYSICAL EXAMINATION:  General: The patient is a  well-nourished female, in no acute distress. Vital signs are BP 112/65 mmHg  Pulse 90  Temp(Src) 98.1 F (36.7 C) (Oral)  Resp 18  Ht 4' 9.5" (1.461 m)  Wt 118 lb 4.8 oz (53.661 kg)  BMI 25.14 kg/m2  SpO2 100% Pulmonary: There is a good air exchange  Abdomen: Soft and non-tender. She does have a lower abdominal incisions. No incisions in her mid abdomen. Musculoskeletal: There are no major deformities.  There is no significant extremity pain. Neurologic: No focal weakness or paresthesias are detected, Skin: There are no ulcer or rashes noted. Psychiatric: The patient has normal affect. Cardiovascular: Palpable radial and palpable dorsalis pedis pulses bilaterally  I did review her lumbar CT in this shows no evidence of atherosclerotic change in her vessels  Impression and Plan:  L5-S1 disc surgery with the recommendation for anterior exposure for repair. I explained my role in exposure for anterior repair. Explained mobilization of intraperitoneal contents, left ureter and iliac arteries and veins. Explain potential injury for these. She has had the extensive pelvic surgery but explained that this usually is not a factor in L5-S1 surgery. She is scheduled for L5-S1 anterior disc repair on 8/24.    Gretta Began [facility] Office: [phone]

## 2015-05-24 ENCOUNTER — Other Ambulatory Visit: Payer: Self-pay

## 2015-05-28 ENCOUNTER — Encounter: Payer: Self-pay | Admitting: Orthopedic Surgery

## 2015-05-29 ENCOUNTER — Encounter (HOSPITAL_COMMUNITY): Payer: Self-pay

## 2015-05-29 ENCOUNTER — Ambulatory Visit (HOSPITAL_COMMUNITY)
Admission: RE | Admit: 2015-05-29 | Discharge: 2015-05-29 | Disposition: A | Discharge status: Home / Self Care | Payer: Medicaid Other | Source: Ambulatory Visit | Attending: Orthopedic Surgery | Admitting: Orthopedic Surgery

## 2015-05-29 ENCOUNTER — Encounter (HOSPITAL_COMMUNITY)
Admission: RE | Admit: 2015-05-29 | Discharge: 2015-05-29 | Disposition: A | Discharge status: Home / Self Care | Payer: Medicaid Other | Source: Ambulatory Visit | Attending: Orthopedic Surgery | Admitting: Orthopedic Surgery

## 2015-05-29 DIAGNOSIS — I4891 Unspecified atrial fibrillation: Secondary | ICD-10-CM | POA: Insufficient documentation

## 2015-05-29 DIAGNOSIS — Z01818 Encounter for other preprocedural examination: Secondary | ICD-10-CM | POA: Diagnosis present

## 2015-05-29 DIAGNOSIS — J45909 Unspecified asthma, uncomplicated: Secondary | ICD-10-CM | POA: Diagnosis not present

## 2015-05-29 DIAGNOSIS — Z01812 Encounter for preprocedural laboratory examination: Secondary | ICD-10-CM | POA: Insufficient documentation

## 2015-05-29 DIAGNOSIS — Z0183 Encounter for blood typing: Secondary | ICD-10-CM | POA: Diagnosis not present

## 2015-05-29 DIAGNOSIS — I498 Other specified cardiac arrhythmias: Secondary | ICD-10-CM | POA: Insufficient documentation

## 2015-05-29 HISTORY — DX: Headache, unspecified: R51.9

## 2015-05-29 HISTORY — DX: Gastro-esophageal reflux disease without esophagitis: K21.9

## 2015-05-29 HISTORY — DX: Headache: R51

## 2015-05-29 LAB — CBC WITH DIFFERENTIAL/PLATELET
BASOS PCT: 0 % (ref 0–1)
Basophils Absolute: 0 10*3/uL (ref 0.0–0.1)
Eosinophils Absolute: 0.1 10*3/uL (ref 0.0–0.7)
Eosinophils Relative: 2 % (ref 0–5)
HEMATOCRIT: 38.9 % (ref 36.0–46.0)
Hemoglobin: 13.6 g/dL (ref 12.0–15.0)
Lymphocytes Relative: 29 % (ref 12–46)
Lymphs Abs: 2.3 10*3/uL (ref 0.7–4.0)
MCH: 31.6 pg (ref 26.0–34.0)
MCHC: 35 g/dL (ref 30.0–36.0)
MCV: 90.3 fL (ref 78.0–100.0)
MONO ABS: 0.5 10*3/uL (ref 0.1–1.0)
Monocytes Relative: 6 % (ref 3–12)
Neutro Abs: 5 10*3/uL (ref 1.7–7.7)
Neutrophils Relative %: 63 % (ref 43–77)
Platelets: 203 10*3/uL (ref 150–400)
RBC: 4.31 MIL/uL (ref 3.87–5.11)
RDW: 13 % (ref 11.5–15.5)
WBC: 7.9 10*3/uL (ref 4.0–10.5)

## 2015-05-29 LAB — URINALYSIS, ROUTINE W REFLEX MICROSCOPIC
BILIRUBIN URINE: NEGATIVE
GLUCOSE, UA: NEGATIVE mg/dL
HGB URINE DIPSTICK: NEGATIVE
Ketones, ur: NEGATIVE mg/dL
Leukocytes, UA: NEGATIVE
Nitrite: NEGATIVE
PH: 6.5 (ref 5.0–8.0)
Protein, ur: NEGATIVE mg/dL
SPECIFIC GRAVITY, URINE: 1.002 — AB (ref 1.005–1.030)
UROBILINOGEN UA: 0.2 mg/dL (ref 0.0–1.0)

## 2015-05-29 LAB — COMPREHENSIVE METABOLIC PANEL
ALBUMIN: 4.1 g/dL (ref 3.5–5.0)
ALT: 17 U/L (ref 14–54)
ANION GAP: 8 (ref 5–15)
AST: 32 U/L (ref 15–41)
Alkaline Phosphatase: 37 U/L — ABNORMAL LOW (ref 38–126)
BILIRUBIN TOTAL: 0.7 mg/dL (ref 0.3–1.2)
BUN: <5 mg/dL — ABNORMAL LOW (ref 6–20)
CO2: 25 mmol/L (ref 22–32)
Calcium: 9.4 mg/dL (ref 8.9–10.3)
Chloride: 105 mmol/L (ref 101–111)
Creatinine, Ser: 0.7 mg/dL (ref 0.44–1.00)
GFR calc non Af Amer: >60 mL/min (ref >60)
Glucose, Bld: 74 mg/dL (ref 65–99)
POTASSIUM: 4.1 mmol/L (ref 3.5–5.1)
Sodium: 138 mmol/L (ref 135–145)
TOTAL PROTEIN: 6.9 g/dL (ref 6.5–8.1)

## 2015-05-29 LAB — PROTIME-INR
INR: 0.88 (ref 0.00–1.49)
PROTHROMBIN TIME: 12.2 s (ref 11.6–15.2)

## 2015-05-29 LAB — SURGICAL PCR SCREEN
MRSA, PCR: NEGATIVE
STAPHYLOCOCCUS AUREUS: NEGATIVE

## 2015-05-29 LAB — TYPE AND SCREEN
ABO/RH(D): A POS
ANTIBODY SCREEN: NEGATIVE

## 2015-05-29 LAB — APTT: aPTT: 24 seconds (ref 24–37)

## 2015-05-29 LAB — ABO/RH: ABO/RH(D): A POS

## 2015-05-29 MED ORDER — CHLORHEXIDINE GLUCONATE 4 % EX LIQD: 60.0000 mL | Freq: Once | CUTANEOUS | Status: DC

## 2015-05-29 NOTE — Pre-Procedure Instructions (Signed)
    Aisha Greenberger  05/29/2015      [facility] [address], [place] - [address] AT [facility] [address] [place] [address] Phone: [phone] Fax: [phone]    Your procedure is scheduled on 06-05-2015   Wednesday   Report to [facility] Admitting at 6:30 A.M.   Call this number if you have problems the morning of surgery:  [phone]   Remember:  Do not eat food or drink liquids after midnight.   Take these medicines the morning of surgery with A SIP OF WATER none   Do not wear jewelry, make-up or nail polish.  Do not wear lotions, powders, or perfumes.  You may not wear deodorant.  Do not shave 48 hours prior to surgery.     Do not bring valuables to the hospital.  [facility] is not responsible for any belongings or valuables.  Contacts, dentures or bridgework may not be worn into surgery.  Leave your suitcase in the car.  After surgery it may be brought to your room.  For patients admitted to the hospital, discharge time will be determined by your treatment team.     Special instructions:  See attached sheet "Preparing for Surgery" for instructions on CHG shower  Please read over the following fact sheets that you were given. Pain Booklet, Coughing and Deep Breathing, Blood Transfusion Information and Surgical Site Infection Prevention

## 2015-06-04 MED ORDER — POVIDONE-IODINE 7.5 % EX SOLN
CUTANEOUS | Status: DC
  Filled 2015-06-04: qty 118

## 2015-06-04 MED ORDER — CEFAZOLIN SODIUM-DEXTROSE 2-3 GM-% IV SOLR
2.0000 g | INTRAVENOUS | Status: AC
  Administered 2015-06-05 (×2): 2 g via INTRAVENOUS
  Filled 2015-06-04: qty 50

## 2015-06-05 ENCOUNTER — Inpatient Hospital Stay (HOSPITAL_COMMUNITY)
Admission: RE | Admit: 2015-06-05 | Discharge: 2015-06-06 | DRG: 455 | Disposition: A | Discharge status: Home / Self Care | Payer: Medicaid Other | Source: Ambulatory Visit | Attending: Orthopedic Surgery | Admitting: Orthopedic Surgery

## 2015-06-05 ENCOUNTER — Inpatient Hospital Stay (HOSPITAL_COMMUNITY): Payer: Medicaid Other | Admitting: Anesthesiology

## 2015-06-05 ENCOUNTER — Encounter (HOSPITAL_COMMUNITY): Payer: Self-pay | Admitting: Surgery

## 2015-06-05 ENCOUNTER — Encounter (HOSPITAL_COMMUNITY)
Admission: RE | Disposition: A | Discharge status: Home / Self Care | Payer: Medicaid Other | Source: Ambulatory Visit | Attending: Orthopedic Surgery

## 2015-06-05 ENCOUNTER — Inpatient Hospital Stay (HOSPITAL_COMMUNITY): Payer: Medicaid Other

## 2015-06-05 DIAGNOSIS — Z91018 Allergy to other foods: Secondary | ICD-10-CM

## 2015-06-05 DIAGNOSIS — F410 Panic disorder [episodic paroxysmal anxiety] without agoraphobia: Secondary | ICD-10-CM | POA: Diagnosis present

## 2015-06-05 DIAGNOSIS — Z87891 Personal history of nicotine dependence: Secondary | ICD-10-CM

## 2015-06-05 DIAGNOSIS — M1711 Unilateral primary osteoarthritis, right knee: Secondary | ICD-10-CM | POA: Diagnosis present

## 2015-06-05 DIAGNOSIS — Z91048 Other nonmedicinal substance allergy status: Secondary | ICD-10-CM

## 2015-06-05 DIAGNOSIS — M5116 Intervertebral disc disorders with radiculopathy, lumbar region: Principal | ICD-10-CM | POA: Diagnosis present

## 2015-06-05 DIAGNOSIS — Z79899 Other long term (current) drug therapy: Secondary | ICD-10-CM

## 2015-06-05 DIAGNOSIS — J45909 Unspecified asthma, uncomplicated: Secondary | ICD-10-CM | POA: Diagnosis present

## 2015-06-05 DIAGNOSIS — M545 Low back pain: Secondary | ICD-10-CM | POA: Diagnosis present

## 2015-06-05 DIAGNOSIS — M5136 Other intervertebral disc degeneration, lumbar region: Secondary | ICD-10-CM

## 2015-06-05 DIAGNOSIS — K219 Gastro-esophageal reflux disease without esophagitis: Secondary | ICD-10-CM | POA: Diagnosis present

## 2015-06-05 DIAGNOSIS — E282 Polycystic ovarian syndrome: Secondary | ICD-10-CM | POA: Diagnosis present

## 2015-06-05 DIAGNOSIS — M5410 Radiculopathy, site unspecified: Secondary | ICD-10-CM | POA: Diagnosis present

## 2015-06-05 DIAGNOSIS — Z419 Encounter for procedure for purposes other than remedying health state, unspecified: Secondary | ICD-10-CM

## 2015-06-05 HISTORY — PX: ABDOMINAL EXPOSURE: SHX5708

## 2015-06-05 HISTORY — PX: ANTERIOR LUMBAR FUSION: SHX1170

## 2015-06-05 SURGERY — ANTERIOR LUMBAR FUSION 1 LEVEL
Anesthesia: General | Site: Back

## 2015-06-05 MED ORDER — HYDROMORPHONE HCL 1 MG/ML IJ SOLN
INTRAMUSCULAR | Status: AC
  Filled 2015-06-05: qty 1

## 2015-06-05 MED ORDER — FENTANYL CITRATE (PF) 100 MCG/2ML IJ SOLN
INTRAMUSCULAR | Status: DC | PRN
  Administered 2015-06-05: 100 ug via INTRAVENOUS
  Administered 2015-06-05: 50 ug via INTRAVENOUS
  Administered 2015-06-05: 100 ug via INTRAVENOUS
  Administered 2015-06-05: 50 ug via INTRAVENOUS
  Administered 2015-06-05: 150 ug via INTRAVENOUS
  Administered 2015-06-05: 50 ug via INTRAVENOUS

## 2015-06-05 MED ORDER — BISACODYL 5 MG PO TBEC: 5.0000 mg | DELAYED_RELEASE_TABLET | Freq: Every day | ORAL | Status: DC | PRN

## 2015-06-05 MED ORDER — THROMBIN 20000 UNITS EX SOLR
CUTANEOUS | Status: AC
  Filled 2015-06-05: qty 20000

## 2015-06-05 MED ORDER — LIDOCAINE HCL (CARDIAC) 20 MG/ML IV SOLN
INTRAVENOUS | Status: DC | PRN
  Administered 2015-06-05: 70 mg via INTRAVENOUS

## 2015-06-05 MED ORDER — FENTANYL CITRATE (PF) 250 MCG/5ML IJ SOLN
INTRAMUSCULAR | Status: AC
  Filled 2015-06-05: qty 5

## 2015-06-05 MED ORDER — BUPIVACAINE-EPINEPHRINE (PF) 0.25% -1:200000 IJ SOLN
INTRAMUSCULAR | Status: AC
  Filled 2015-06-05: qty 30

## 2015-06-05 MED ORDER — METHYLENE BLUE 1 % INJ SOLN
INTRAMUSCULAR | Status: AC
  Filled 2015-06-05: qty 10

## 2015-06-05 MED ORDER — SODIUM CHLORIDE 0.9 % IJ SOLN: 3.0000 mL | INTRAMUSCULAR | Status: DC | PRN

## 2015-06-05 MED ORDER — GLYCOPYRROLATE 0.2 MG/ML IJ SOLN
INTRAMUSCULAR | Status: DC | PRN
  Administered 2015-06-05: 0.2 mg via INTRAVENOUS

## 2015-06-05 MED ORDER — SODIUM CHLORIDE 0.9 % IV SOLN: 250.0000 mL | INTRAVENOUS | Status: DC

## 2015-06-05 MED ORDER — MEPERIDINE HCL 25 MG/ML IJ SOLN: 6.2500 mg | INTRAMUSCULAR | Status: DC | PRN

## 2015-06-05 MED ORDER — 0.9 % SODIUM CHLORIDE (POUR BTL) OPTIME
TOPICAL | Status: DC | PRN
  Administered 2015-06-05: 3000 mL

## 2015-06-05 MED ORDER — PROMETHAZINE HCL 25 MG/ML IJ SOLN: 6.2500 mg | INTRAMUSCULAR | Status: DC | PRN

## 2015-06-05 MED ORDER — DOCUSATE SODIUM 100 MG PO CAPS
100.0000 mg | ORAL_CAPSULE | Freq: Two times a day (BID) | ORAL | Status: DC
  Administered 2015-06-05 – 2015-06-06 (×2): 100 mg via ORAL
  Filled 2015-06-05 (×2): qty 1

## 2015-06-05 MED ORDER — HYDROMORPHONE HCL 1 MG/ML IJ SOLN
0.2500 mg | INTRAMUSCULAR | Status: DC | PRN
  Administered 2015-06-05 (×2): 0.5 mg via INTRAVENOUS

## 2015-06-05 MED ORDER — LIDOCAINE HCL (CARDIAC) 20 MG/ML IV SOLN
INTRAVENOUS | Status: AC
  Filled 2015-06-05: qty 5

## 2015-06-05 MED ORDER — PHENOL 1.4 % MT LIQD: 1.0000 | OROMUCOSAL | Status: DC | PRN

## 2015-06-05 MED ORDER — ACETAMINOPHEN 325 MG PO TABS: 650.0000 mg | ORAL_TABLET | ORAL | Status: DC | PRN

## 2015-06-05 MED ORDER — PROPOFOL INFUSION 10 MG/ML OPTIME
INTRAVENOUS | Status: DC | PRN
  Administered 2015-06-05: 50 ug/kg/min via INTRAVENOUS

## 2015-06-05 MED ORDER — DIAZEPAM 5 MG PO TABS
5.0000 mg | ORAL_TABLET | Freq: Four times a day (QID) | ORAL | Status: DC | PRN
  Administered 2015-06-05 – 2015-06-06 (×3): 5 mg via ORAL
  Filled 2015-06-05 (×3): qty 1

## 2015-06-05 MED ORDER — SENNOSIDES-DOCUSATE SODIUM 8.6-50 MG PO TABS: 1.0000 | ORAL_TABLET | Freq: Every evening | ORAL | Status: DC | PRN

## 2015-06-05 MED ORDER — ZOLPIDEM TARTRATE 5 MG PO TABS: 5.0000 mg | ORAL_TABLET | Freq: Every evening | ORAL | Status: DC | PRN

## 2015-06-05 MED ORDER — OXYCODONE-ACETAMINOPHEN 5-325 MG PO TABS
1.0000 | ORAL_TABLET | ORAL | Status: DC | PRN
  Administered 2015-06-05 – 2015-06-06 (×5): 2 via ORAL
  Filled 2015-06-05 (×4): qty 2

## 2015-06-05 MED ORDER — PHENYLEPHRINE HCL 10 MG/ML IJ SOLN
10.0000 mg | INTRAMUSCULAR | Status: DC | PRN
  Administered 2015-06-05: 10 ug/min via INTRAVENOUS

## 2015-06-05 MED ORDER — SODIUM CHLORIDE 0.9 % IV SOLN
0.0125 ug/kg/min | INTRAVENOUS | Status: AC
  Administered 2015-06-05: .05 ug/kg/min via INTRAVENOUS
  Filled 2015-06-05: qty 2000

## 2015-06-05 MED ORDER — THROMBIN 20000 UNITS EX KIT
PACK | CUTANEOUS | Status: DC | PRN
  Administered 2015-06-05: 20000 [IU] via TOPICAL

## 2015-06-05 MED ORDER — CEFAZOLIN SODIUM 1-5 GM-% IV SOLN
1.0000 g | Freq: Three times a day (TID) | INTRAVENOUS | Status: AC
  Administered 2015-06-05 – 2015-06-06 (×2): 1 g via INTRAVENOUS
  Filled 2015-06-05 (×2): qty 50

## 2015-06-05 MED ORDER — EPHEDRINE SULFATE 50 MG/ML IJ SOLN
INTRAMUSCULAR | Status: AC
  Filled 2015-06-05: qty 1

## 2015-06-05 MED ORDER — ROCURONIUM BROMIDE 100 MG/10ML IV SOLN
INTRAVENOUS | Status: DC | PRN
  Administered 2015-06-05: 35 mg via INTRAVENOUS

## 2015-06-05 MED ORDER — MIDAZOLAM HCL 2 MG/2ML IJ SOLN
INTRAMUSCULAR | Status: AC
  Filled 2015-06-05: qty 4

## 2015-06-05 MED ORDER — PROPOFOL 10 MG/ML IV BOLUS
INTRAVENOUS | Status: DC | PRN
  Administered 2015-06-05: 140 mg via INTRAVENOUS

## 2015-06-05 MED ORDER — BUPIVACAINE-EPINEPHRINE 0.25% -1:200000 IJ SOLN
INTRAMUSCULAR | Status: DC | PRN
  Administered 2015-06-05: 23 mL

## 2015-06-05 MED ORDER — MIDAZOLAM HCL 5 MG/5ML IJ SOLN
INTRAMUSCULAR | Status: DC | PRN
Start: 2015-06-05 — End: 2015-06-05
  Administered 2015-06-05 (×2): 1 mg via INTRAVENOUS

## 2015-06-05 MED ORDER — ROCURONIUM BROMIDE 50 MG/5ML IV SOLN
INTRAVENOUS | Status: AC
  Filled 2015-06-05: qty 1

## 2015-06-05 MED ORDER — FLEET ENEMA 7-19 GM/118ML RE ENEM: 1.0000 | ENEMA | Freq: Once | RECTAL | Status: DC | PRN

## 2015-06-05 MED ORDER — DEXAMETHASONE SODIUM PHOSPHATE 4 MG/ML IJ SOLN
INTRAMUSCULAR | Status: DC | PRN
  Administered 2015-06-05: 8 mg via INTRAVENOUS

## 2015-06-05 MED ORDER — ONDANSETRON HCL 4 MG/2ML IJ SOLN
INTRAMUSCULAR | Status: DC | PRN
  Administered 2015-06-05: 4 mg via INTRAVENOUS

## 2015-06-05 MED ORDER — ONDANSETRON 8 MG PO TBDP
8.0000 mg | ORAL_TABLET | Freq: Three times a day (TID) | ORAL | Status: DC | PRN
  Filled 2015-06-05: qty 1

## 2015-06-05 MED ORDER — MENTHOL 3 MG MT LOZG: 1.0000 | LOZENGE | OROMUCOSAL | Status: DC | PRN

## 2015-06-05 MED ORDER — ONDANSETRON HCL 4 MG/2ML IJ SOLN: 4.0000 mg | INTRAMUSCULAR | Status: DC | PRN

## 2015-06-05 MED ORDER — LACTATED RINGERS IV SOLN: INTRAVENOUS | Status: DC

## 2015-06-05 MED ORDER — ACETAMINOPHEN 650 MG RE SUPP: 650.0000 mg | RECTAL | Status: DC | PRN

## 2015-06-05 MED ORDER — PROPOFOL 10 MG/ML IV BOLUS
INTRAVENOUS | Status: AC
  Filled 2015-06-05: qty 20

## 2015-06-05 MED ORDER — PHENYLEPHRINE 40 MCG/ML (10ML) SYRINGE FOR IV PUSH (FOR BLOOD PRESSURE SUPPORT)
PREFILLED_SYRINGE | INTRAVENOUS | Status: AC
  Filled 2015-06-05: qty 10

## 2015-06-05 MED ORDER — ESTRADIOL 2 MG PO TABS
2.0000 mg | ORAL_TABLET | Freq: Every day | ORAL | Status: DC
  Administered 2015-06-05 – 2015-06-06 (×2): 2 mg via ORAL
  Filled 2015-06-05 (×2): qty 1

## 2015-06-05 MED ORDER — LACTATED RINGERS IV SOLN
INTRAVENOUS | Status: DC | PRN
  Administered 2015-06-05 (×3): via INTRAVENOUS

## 2015-06-05 MED ORDER — ALUM & MAG HYDROXIDE-SIMETH 200-200-20 MG/5ML PO SUSP
30.0000 mL | Freq: Four times a day (QID) | ORAL | Status: DC | PRN
Start: 2015-06-05 — End: 2015-06-06

## 2015-06-05 MED ORDER — MORPHINE SULFATE (PF) 2 MG/ML IV SOLN
1.0000 mg | INTRAVENOUS | Status: DC | PRN
  Administered 2015-06-05: 4 mg via INTRAVENOUS
  Filled 2015-06-05: qty 2

## 2015-06-05 MED ORDER — SODIUM CHLORIDE 0.9 % IV SOLN: INTRAVENOUS | Status: DC

## 2015-06-05 MED ORDER — ONDANSETRON HCL 4 MG/2ML IJ SOLN
INTRAMUSCULAR | Status: AC
  Filled 2015-06-05: qty 2

## 2015-06-05 MED ORDER — OXYCODONE-ACETAMINOPHEN 5-325 MG PO TABS
ORAL_TABLET | ORAL | Status: DC
Start: 2015-06-05 — End: 2015-06-05
  Filled 2015-06-05: qty 2

## 2015-06-05 MED ORDER — SODIUM CHLORIDE 0.9 % IJ SOLN
INTRAMUSCULAR | Status: AC
  Filled 2015-06-05: qty 10

## 2015-06-05 MED ORDER — SODIUM CHLORIDE 0.9 % IJ SOLN
3.0000 mL | Freq: Two times a day (BID) | INTRAMUSCULAR | Status: DC
  Administered 2015-06-05: 3 mL via INTRAVENOUS

## 2015-06-05 MED FILL — Heparin Sodium (Porcine) Inj 1000 Unit/ML: INTRAMUSCULAR | Qty: 30 | Status: AC

## 2015-06-05 MED FILL — Sodium Chloride IV Soln 0.9%: INTRAVENOUS | Qty: 1000 | Status: AC

## 2015-06-05 SURGICAL SUPPLY — 134 items
APPLIER CLIP 11 MED OPEN (CLIP) ×5
BENZOIN TINCTURE PRP APPL 2/3 (GAUZE/BANDAGES/DRESSINGS) ×5 IMPLANT
BLADE SURG 10 STRL SS (BLADE) ×5 IMPLANT
BLADE SURG ROTATE 9660 (MISCELLANEOUS) IMPLANT
BUR PRESCISION 1.7 ELITE (BURR) IMPLANT
BUR ROUND PRECISION 4.0 (BURR) IMPLANT
BUR ROUND PRECISION 4.0MM (BURR)
BUR SABER RD CUTTING 3.0 (BURR) IMPLANT
BUR SABER RD CUTTING 3.0MM (BURR)
CAGE COUGAR MED 14 10 (Cage) ×4 IMPLANT
CAGE COUGAR MED 14MM 10 (Cage) ×1 IMPLANT
CARTRIDGE OIL MAESTRO DRILL (MISCELLANEOUS) ×3 IMPLANT
CLIP APPLIE 11 MED OPEN (CLIP) ×3 IMPLANT
CLIP LIGATING EXTRA MED SLVR (CLIP) ×5 IMPLANT
CLIP LIGATING EXTRA SM BLUE (MISCELLANEOUS) ×5 IMPLANT
CLOSURE STERI-STRIP 1/2X4 (GAUZE/BANDAGES/DRESSINGS) ×1
CLOSURE WOUND 1/2 X4 (GAUZE/BANDAGES/DRESSINGS) ×2
CLSR STERI-STRIP ANTIMIC 1/2X4 (GAUZE/BANDAGES/DRESSINGS) ×4 IMPLANT
CONT SPEC STER OR (MISCELLANEOUS) ×5 IMPLANT
CORDS BIPOLAR (ELECTRODE) ×5 IMPLANT
COVER MAYO STAND STRL (DRAPES) ×10 IMPLANT
COVER SURGICAL LIGHT HANDLE (MISCELLANEOUS) ×5 IMPLANT
DERMABOND ADVANCED (GAUZE/BANDAGES/DRESSINGS) ×2
DERMABOND ADVANCED .7 DNX12 (GAUZE/BANDAGES/DRESSINGS) ×3 IMPLANT
DIFFUSER DRILL AIR PNEUMATIC (MISCELLANEOUS) ×5 IMPLANT
DRAIN CHANNEL 15F RND FF W/TCR (WOUND CARE) IMPLANT
DRAPE C-ARM 42X72 X-RAY (DRAPES) ×10 IMPLANT
DRAPE C-ARMOR (DRAPES) IMPLANT
DRAPE INCISE IOBAN 66X45 STRL (DRAPES) IMPLANT
DRAPE POUCH INSTRU U-SHP 10X18 (DRAPES) ×5 IMPLANT
DRAPE SURG 17X23 STRL (DRAPES) ×20 IMPLANT
DRSG MEPILEX BORDER 4X12 (GAUZE/BANDAGES/DRESSINGS) ×5 IMPLANT
DURAPREP 26ML APPLICATOR (WOUND CARE) ×5 IMPLANT
ELECT BLADE 4.0 EZ CLEAN MEGAD (MISCELLANEOUS) ×5
ELECT CAUTERY BLADE 6.4 (BLADE) ×5 IMPLANT
ELECT REM PT RETURN 9FT ADLT (ELECTROSURGICAL) ×5
ELECTRODE BLDE 4.0 EZ CLN MEGD (MISCELLANEOUS) ×3 IMPLANT
ELECTRODE REM PT RTRN 9FT ADLT (ELECTROSURGICAL) ×3 IMPLANT
EVACUATOR SILICONE 100CC (DRAIN) IMPLANT
FEE INTRAOP MONITOR IMPULS NCS (MISCELLANEOUS) ×3 IMPLANT
GAUZE SPONGE 4X4 12PLY STRL (GAUZE/BANDAGES/DRESSINGS) ×5 IMPLANT
GAUZE SPONGE 4X4 16PLY XRAY LF (GAUZE/BANDAGES/DRESSINGS) ×20 IMPLANT
GLOVE BIO SURGEON STRL SZ7 (GLOVE) ×5 IMPLANT
GLOVE BIO SURGEON STRL SZ8 (GLOVE) ×5 IMPLANT
GLOVE BIOGEL PI IND STRL 7.0 (GLOVE) ×3 IMPLANT
GLOVE BIOGEL PI IND STRL 8 (GLOVE) ×6 IMPLANT
GLOVE BIOGEL PI INDICATOR 7.0 (GLOVE) ×2
GLOVE BIOGEL PI INDICATOR 8 (GLOVE) ×4
GLOVE SS BIOGEL STRL SZ 7.5 (GLOVE) ×3 IMPLANT
GLOVE SUPERSENSE BIOGEL SZ 7.5 (GLOVE) ×2
GOWN STRL REUS W/ TWL LRG LVL3 (GOWN DISPOSABLE) ×15 IMPLANT
GOWN STRL REUS W/ TWL XL LVL3 (GOWN DISPOSABLE) ×3 IMPLANT
GOWN STRL REUS W/TWL LRG LVL3 (GOWN DISPOSABLE) ×10
GOWN STRL REUS W/TWL XL LVL3 (GOWN DISPOSABLE) ×2
GUIDEWIRE BLUNT VIPER II 1.45 (WIRE) ×5 IMPLANT
GUIDEWIRE SHARP VIPER II (WIRE) ×20 IMPLANT
HEMOSTAT SURGICEL 2X14 (HEMOSTASIS) ×5 IMPLANT
INSERT FOGARTY 61MM (MISCELLANEOUS) IMPLANT
INSERT FOGARTY SM (MISCELLANEOUS) IMPLANT
INTRAOP MONITOR FEE IMPULS NCS (MISCELLANEOUS) ×3
INTRAOP MONITOR FEE IMPULSE (MISCELLANEOUS) ×2
IV CATH 14GX2 1/4 (CATHETERS) ×5 IMPLANT
KIT BASIN OR (CUSTOM PROCEDURE TRAY) ×5 IMPLANT
KIT POSITION SURG JACKSON T1 (MISCELLANEOUS) ×5 IMPLANT
KIT ROOM TURNOVER OR (KITS) ×10 IMPLANT
LOOP VESSEL MAXI BLUE (MISCELLANEOUS) IMPLANT
LOOP VESSEL MINI RED (MISCELLANEOUS) IMPLANT
MARKER SKIN DUAL TIP RULER LAB (MISCELLANEOUS) ×5 IMPLANT
MIX DBX 10CC 35% BONE (Bone Implant) ×5 IMPLANT
NDL SAFETY ECLIPSE 18X1.5 (NEEDLE) ×3 IMPLANT
NEEDLE 22X1 1/2 (OR ONLY) (NEEDLE) ×5 IMPLANT
NEEDLE HYPO 18GX1.5 SHARP (NEEDLE) ×2
NEEDLE HYPO 25GX1X1/2 BEV (NEEDLE) ×5 IMPLANT
NEEDLE JAMSHIDI VIPER (NEEDLE) ×15 IMPLANT
NEEDLE SPNL 18GX3.5 QUINCKE PK (NEEDLE) ×10 IMPLANT
NS IRRIG 1000ML POUR BTL (IV SOLUTION) ×5 IMPLANT
OIL CARTRIDGE MAESTRO DRILL (MISCELLANEOUS) ×5
PACK LAMINECTOMY ORTHO (CUSTOM PROCEDURE TRAY) ×5 IMPLANT
PACK UNIVERSAL I (CUSTOM PROCEDURE TRAY) ×5 IMPLANT
PAD ARMBOARD 7.5X6 YLW CONV (MISCELLANEOUS) ×20 IMPLANT
PATTIES SURGICAL .5 X1 (DISPOSABLE) ×5 IMPLANT
PATTIES SURGICAL .5X1.5 (GAUZE/BANDAGES/DRESSINGS) ×5 IMPLANT
PROBE PEDCLE PROBE MAGSTM DISP (MISCELLANEOUS) ×5 IMPLANT
ROD VIPER II LORDOSED 5.5X40 (Rod) ×10 IMPLANT
SCREW POLY VIPER2 7X40MM (Screw) ×15 IMPLANT
SCREW SET SINGLE INNER MIS (Screw) ×20 IMPLANT
SCREW VIPER II XTAB POLY 7X45M (Screw) ×5 IMPLANT
SPONGE GAUZE 4X4 12PLY STER LF (GAUZE/BANDAGES/DRESSINGS) ×5 IMPLANT
SPONGE INTESTINAL PEANUT (DISPOSABLE) ×20 IMPLANT
SPONGE LAP 18X18 X RAY DECT (DISPOSABLE) IMPLANT
SPONGE LAP 4X18 X RAY DECT (DISPOSABLE) IMPLANT
SPONGE SURGIFOAM ABS GEL 100 (HEMOSTASIS) ×10 IMPLANT
STAPLER VISISTAT 35W (STAPLE) IMPLANT
STRIP CLOSURE SKIN 1/2X4 (GAUZE/BANDAGES/DRESSINGS) ×8 IMPLANT
SURGIFLO W/THROMBIN 8M KIT (HEMOSTASIS) IMPLANT
SUT MNCRL AB 4-0 PS2 18 (SUTURE) ×15 IMPLANT
SUT PDS AB 1 CTX 36 (SUTURE) ×10 IMPLANT
SUT PROLENE 4 0 RB 1 (SUTURE) ×8
SUT PROLENE 4-0 RB1 .5 CRCL 36 (SUTURE) ×12 IMPLANT
SUT PROLENE 5 0 C 1 24 (SUTURE) IMPLANT
SUT PROLENE 5 0 CC1 (SUTURE) IMPLANT
SUT PROLENE 6 0 C 1 30 (SUTURE) ×5 IMPLANT
SUT PROLENE 6 0 CC (SUTURE) IMPLANT
SUT SILK 0 TIES 10X30 (SUTURE) ×5 IMPLANT
SUT SILK 2 0 TIES 10X30 (SUTURE) ×10 IMPLANT
SUT SILK 2 0SH CR/8 30 (SUTURE) IMPLANT
SUT SILK 3 0 TIES 10X30 (SUTURE) ×10 IMPLANT
SUT SILK 3 0SH CR/8 30 (SUTURE) IMPLANT
SUT VIC AB 0 CT1 18XCR BRD 8 (SUTURE) ×3 IMPLANT
SUT VIC AB 0 CT1 27 (SUTURE) ×2
SUT VIC AB 0 CT1 27XBRD ANBCTR (SUTURE) ×3 IMPLANT
SUT VIC AB 0 CT1 8-18 (SUTURE) ×2
SUT VIC AB 1 CT1 18XCR BRD 8 (SUTURE) ×6 IMPLANT
SUT VIC AB 1 CT1 27 (SUTURE) ×4
SUT VIC AB 1 CT1 27XBRD ANBCTR (SUTURE) ×6 IMPLANT
SUT VIC AB 1 CT1 8-18 (SUTURE) ×4
SUT VIC AB 1 CTX 36 (SUTURE) ×4
SUT VIC AB 1 CTX36XBRD ANBCTR (SUTURE) ×6 IMPLANT
SUT VIC AB 2-0 CT1 36 (SUTURE) ×5 IMPLANT
SUT VIC AB 2-0 CT2 18 VCP726D (SUTURE) ×5 IMPLANT
SUT VIC AB 3-0 SH 27 (SUTURE) ×2
SUT VIC AB 3-0 SH 27X BRD (SUTURE) ×3 IMPLANT
SYR 20CC LL (SYRINGE) ×5 IMPLANT
SYR BULB IRRIGATION 50ML (SYRINGE) ×5 IMPLANT
SYR CONTROL 10ML LL (SYRINGE) ×10 IMPLANT
SYR TB 1ML LUER SLIP (SYRINGE) ×5 IMPLANT
TAP CANN VIPER2 DL 6.0 (TAP) ×10 IMPLANT
TAPE CLOTH SURG 6X10 WHT LF (GAUZE/BANDAGES/DRESSINGS) ×5 IMPLANT
TOWEL OR 17X24 6PK STRL BLUE (TOWEL DISPOSABLE) ×5 IMPLANT
TOWEL OR 17X26 10 PK STRL BLUE (TOWEL DISPOSABLE) ×5 IMPLANT
TRAY FOLEY CATH 16FR SILVER (SET/KITS/TRAYS/PACK) ×5 IMPLANT
TRAY FOLEY CATH 16FRSI W/METER (SET/KITS/TRAYS/PACK) ×5 IMPLANT
WATER STERILE IRR 1000ML POUR (IV SOLUTION) ×5 IMPLANT
YANKAUER SUCT BULB TIP NO VENT (SUCTIONS) ×5 IMPLANT

## 2015-06-05 NOTE — H&P (Signed)
PREOPERATIVE H&P  Chief Complaint: Low back pain  HPI: Katrina Jones is a 29 y.o. female who presents with ongoing pain in the low back x 2 years.   MRI reveals DDD L5/S1. Patient's pain has become debilitating.   Patient has failed multiple forms of conservative care and continues to have pain (see office notes for additional details regarding the patient's full course of treatment)  Past Medical History  Diagnosis Date  . Polycystic ovarian syndrome   . Asthma     exersise induced  . Anxiety     Panic Attacks  . Arthritis     left knee  . Allergy   . Atrial fibrillation     Pt is unaware of where this diagnoisis came from  . GERD (gastroesophageal reflux disease)     Tums  . Headache    Past Surgical History  Procedure Laterality Date  . Knee surgery Left     ac tear   . Mirina removal    . Tubal ligation    . Cesarean section      x 2  . Tonsillectomy      and Adnoidectomy  . Cholecystectomy N/A 05/22/2014    Procedure: LAPAROSCOPIC CHOLECYSTECTOMY;  Surgeon: Axel Filler, MD;  Location: [facility] OR;  Service: General;  Laterality: N/A;  . Abdominal hysterectomy      Laparoscopic hysterectomy   Social History   Social History  . Marital Status: Legally Separated    Spouse Name: N/A  . Number of Children: N/A  . Years of Education: N/A   Social History Main Topics  . Smoking status: Former Smoker -- 0 years    Types: Cigarettes    Quit date: 06/12/2012  . Smokeless tobacco: Never Used  . Alcohol Use: 3.6 oz/week    6 Cans of beer per week     Comment: socially  . Drug Use: No  . Sexual Activity:    Partners: Female    Copy: Surgical     Comment: Vap 1 mg Nicotene   Other Topics Concern  . None   Social History Narrative   Family History  Problem Relation Age of Onset  . Cancer Father   . Cancer Maternal Aunt   . Cancer Paternal Grandfather   . Diabetes Paternal Grandfather   . Heart disease Paternal Grandfather     Allergies  Allergen Reactions  . Lactose Intolerance (Gi) Other (See Comments)    Upset stomach  . Other Other (See Comments)    Mold and dust allergy   Prior to Admission medications   Medication Sig Start Date End Date Taking? Authorizing Provider  estradiol (ESTRACE) 2 MG tablet Take 2 mg by mouth at bedtime.    Yes Historical Provider, MD  ondansetron (ZOFRAN-ODT) 8 MG disintegrating tablet Take 8 mg by mouth every 8 (eight) hours as needed for nausea or vomiting.   Yes Historical Provider, MD  PRESCRIPTION MEDICATION Inject into the skin every other day. Antigen for allergy immunotherapy, each dose is adjusted per monthly visits to allergist (8-40 units on an insulin syringe)   Yes Historical Provider, MD     All other systems have been reviewed and were otherwise negative with the exception of those mentioned in the HPI and as above.  Physical Exam: Filed Vitals:   06/05/15 0659  BP: 110/64  Pulse: 59  Temp: 97.6 F (36.4 C)  Resp: 18    General: Alert, no acute distress Cardiovascular: No pedal  edema Respiratory: No cyanosis, no use of accessory musculature Skin: No lesions in the area of chief complaint Neurologic: Sensation intact distally Psychiatric: Patient is competent for consent with normal mood and affect Lymphatic: No axillary or cervical lymphadenopathy   Assessment/Plan: Low back pain Plan for Procedure(s): ANTERIOR LUMBAR FUSION 1 LEVEL POSTERIOR LUMBAR FUSION 1 LEVEL    Emilee Hero, MD 06/05/2015 7:56 AM

## 2015-06-05 NOTE — Op Note (Signed)
    OPERATIVE REPORT  DATE OF SURGERY: 06/05/2015  PATIENT: Katrina Jones, 29 y.o. female MRN: [id number]  DOB: 04/20/1986  PRE-OPERATIVE DIAGNOSIS: Degenerative disc disease  POST-OPERATIVE DIAGNOSIS:  Same  PROCEDURE: Anterior exposure for L5-S1 disc surgery  SURGEON:  Gretta Began, M.D.  Co-surgeon for the exposure Dr.Dumonski  ANESTHESIA:  Gen.  EBL: 150 cc ml  Total I/O In: 2000 [I.V.:2000] Out: 650 [Urine:500; Blood:150]  BLOOD ADMINISTERED: None  DRAINS: None    COUNTS CORRECT:  YES  PLAN OF CARE: PACU   PATIENT DISPOSITION:  PACU - hemodynamically stable  PROCEDURE DETAILS: The patient was taken to the operative placed supine position where the area the abdomen was prepped and draped in usual sterile fashion. A transverse incision was made low in the pelvis from the midline to the left. The subcutaneous fat was opened in line with skin incision with electrocautery. Fat was mobilized off the anterior rectus sheath the anterior rectus sheath was opened with a left cautery in line with skin incision. Patient had prior pelvic surgery in the rectus sheath will was adherent to the rectus muscle. Rectus muscle was mobilized laterally and the retroperitoneum was entered bluntly laterally. The posterior rectus sheath was opened laterally with scissors. Blunt dissection was used to mobilize peritoneal structures to the right. The iliac vessels were identified at the aortic and iliac bifurcation. Middle sacral vein was clipped with hemoclips and divided. Blunt dissection was used for exposure of the L5-S1 disc giving exposure and the spirit and inferior. The Thompson retractor was brought onto the field and the reverse lip 120 mm blades were positioned to the right and left of the L5-S1 disc. The malleable factors were positioned superiorly and inferiorly for exposure. X-ray was used to confirm that this was indeed the L5-S1 disc. The remainder the procedure will be dictated as a  separate note by Dr.Dumonski   Gretta Began, M.D. 06/05/2015 12:38 PM

## 2015-06-05 NOTE — Anesthesia Preprocedure Evaluation (Addendum)
Anesthesia Evaluation  Patient identified by MRN, date of birth, ID band Patient awake    Reviewed: Allergy & Precautions, NPO status , Patient's Chart, lab work & pertinent test results  Airway Mallampati: I  TM Distance: >3 FB Neck ROM: Full    Dental  (+) Teeth Intact   Pulmonary former smoker,  breath sounds clear to auscultation        Cardiovascular Rhythm:Regular Rate:Normal     Neuro/Psych PSYCHIATRIC DISORDERS Anxiety    GI/Hepatic Neg liver ROS, GERD-  Medicated,  Endo/Other  negative endocrine ROSMorbid obesity  Renal/GU negative Renal ROS  negative genitourinary   Musculoskeletal  (+) Arthritis -, Osteoarthritis,    Abdominal   Peds negative pediatric ROS (+)  Hematology negative hematology ROS (+)   Anesthesia Other Findings   Reproductive/Obstetrics negative OB ROS                            Lab Results  Component Value Date   WBC 7.9 05/29/2015   HGB 13.6 05/29/2015   HCT 38.9 05/29/2015   MCV 90.3 05/29/2015   PLT 203 05/29/2015   Lab Results  Component Value Date   CREATININE 0.70 05/29/2015   BUN <5* 05/29/2015   NA 138 05/29/2015   K 4.1 05/29/2015   CL 105 05/29/2015   CO2 25 05/29/2015   Lab Results  Component Value Date   INR 0.88 05/29/2015   05/29/2015: EKG: normal EKG, normal sinus rhythm.   Anesthesia Physical Anesthesia Plan  ASA: II  Anesthesia Plan: General   Post-op Pain Management:    Induction: Intravenous  Airway Management Planned: Oral ETT  Additional Equipment:   Intra-op Plan:   Post-operative Plan: Extubation in OR  Informed Consent: I have reviewed the patients History and Physical, chart, labs and discussed the procedure including the risks, benefits and alternatives for the proposed anesthesia with the patient or authorized representative who has indicated his/her understanding and acceptance.   Dental advisory  given  Plan Discussed with: CRNA  Anesthesia Plan Comments:         Anesthesia Quick Evaluation

## 2015-06-05 NOTE — Op Note (Signed)
NAMEILLYANNA, PETILLO NO.:  [id number]  MEDICAL RECORD NO.:  [id number]  LOCATION:  3C05C                        FACILITY:  [facility]  PHYSICIAN:  Estill Bamberg, MD      DATE OF BIRTH:  December 08, 1985  DATE OF PROCEDURE:  06/05/2015                              OPERATIVE REPORT   PREOPERATIVE DIAGNOSES: 1. L5-S1 degenerative disk disease. 2. Grade 1 L5-S1 spondylolisthesis. 3. Bilateral L5 pars interarticularis defect.  POSTOPERATIVE DIAGNOSES: 1. L5-S1 degenerative disk disease. 2. Grade 1 L5-S1 spondylolisthesis. 3. Bilateral L5 pars interarticularis defect.  PROCEDURES: 1. Anterior lumbar interbody fusion, L5-S1. 2. Insertion of interbody device x1 (14-mm medium, 10-degree Cougar     intervertebral cage). 3. Use of morselized allograft - DBX mix. 4. Placement of anterior instrumentation, L5-S1. 5. Use of morselized allograft. 6. Placement of posterior instrumentation, L5, S1. 7. L5-S1 posterior spinal fusion.  FIRST ASSISTANT FOR ANTERIOR RETROPERITONEAL EXPOSURE:  Larina Earthly, M.D.  SURGEON:  Estill Bamberg, MD  ASSISTANT:  Jason Coop, PA-C.  ANESTHESIA:  General endotracheal anesthesia.  COMPLICATIONS:  None.  DISPOSITION:  Stable.  ESTIMATED BLOOD LOSS:  Minimal.  INDICATIONS FOR SURGERY:  Briefly, Katrina Jones is a very pleasant 29 year old female, who did present to me with a 2-year history of ongoing and rather debilitating pain.  Her MRI was notable for the findings reflected above.  She did have multiple forms of conservative care, and continued to have ongoing and rather debilitating pain.  Given her ongoing pain and lack of improvement with the appropriate conservative treatment measures, we did discuss proceeding with the procedure noted above.  The patient did fully understand the risks and limitations of the procedure.  Of note, the patient did fully understand that surgery may or may not address the ongoing low back pain that she has  been experiencing.  OPERATIVE DETAILS:  On June 05, 2015, the patient was brought to Surgery and general endotracheal anesthesia was administered.  The patient was placed supine on a hospital bed.  A bump was placed under the patient's buttock.  The abdomen was prepped and draped and a time- out was performed.  A retroperitoneal exposure was then performed by Dr. Gretta Began.  I did function as his first assistant for the entire exposure.  Once the intervertebral space was identified, I did perform a thorough and complete L5-S1 intervertebral diskectomy.  The endplates were appropriately prepared and the appropriate size interbody spacer was packed with the DBX mix and tamped into position.  I was very pleased with the appearance of the intervertebral implant on both AP and lateral fluoroscopic images.  I then turned my attention towards the anterior instrumentation portion of the procedure.  I did use a rongeur to remove the anterior lip of the S1 vertebral body.  I did use an awl and I did place a vertebral body screw through the S1 vertebral body, starting at the anterior superior margin of it.  A washer was secured to the screw to help with the fixation of the intervertebral implant.  The wound was then copiously irrigated.  The fascia was then closed using #1 PDS.  The subcutaneous layer was then closed using 2-0 Vicryl  followed by 3-0 Monocryl.  Benzoin, Steri-Strips and a sterile dressing were applied.  The patient was then appropriately positioned prone on a well- padded flat Jackson spinal bed.  Again, the back was prepped and draped in the usual sterile fashion.  A time-out procedure was again performed. I then made paramedian incisions on the right and left sides just lateral to the pedicles.  On the left side, I did expose the posterolateral gutter.  The posterolateral gutter was decorticated to aid in the fusion and the remainder of the DBX mix was placed into  the posterolateral gutter.  Then, using AP and lateral fluoroscopy, I did insert Jamshidi needles across the L5 and S1 pedicles bilaterally. Guidewires were advanced through the needles.  I then used a 6-mm tap and triggered EMG was used to test each of the taps, there was no tap that tested below 20 milliamps.  I then placed 7 x 40-mm screws bilaterally at L5 and at S1.  On the right at S1, I did place a 7 x 35- mm screw.  A 40-mm rod was secured into the tulip heads of the screws bilaterally.  Caps were placed and a final locking procedure was performed.  I was very pleased with the final AP and lateral fluoroscopic images.  The wounds were then copiously irrigated.  The fascia, subcutaneous layer, and skin was closed in layers using #1 Vicryl followed by 2-0 Vicryl, followed by 3-0 Monocryl.  Benzoin and Steri-Strips were applied followed by sterile dressing.  All instrument counts were correct at the termination of the procedure.  Of note, Jason Coop was my assistant throughout the surgery, and did aid in retraction, suctioning and closure throughout the surgery including the anterior and posterior portions of the procedure.     Estill Bamberg, MD     MD/MEDQ  D:  06/05/2015  T:  06/05/2015  Job:  [id number]

## 2015-06-05 NOTE — Anesthesia Procedure Notes (Signed)
Procedure Name: Intubation Date/Time: 06/05/2015 8:39 AM Performed by: Ferol Luz L Pre-anesthesia Checklist: Patient identified, Emergency Drugs available, Suction available, Patient being monitored and Timeout performed Patient Re-evaluated:Patient Re-evaluated prior to inductionOxygen Delivery Method: Circle system utilized Preoxygenation: Pre-oxygenation with 100% oxygen Intubation Type: IV induction Ventilation: Mask ventilation without difficulty Laryngoscope Size: Miller and 2 Grade View: Grade I Tube type: Oral Tube size: 7.0 mm Number of attempts: 1 Airway Equipment and Method: Stylet Placement Confirmation: ETT inserted through vocal cords under direct vision,  positive ETCO2 and breath sounds checked- equal and bilateral Secured at: 21 cm Tube secured with: Tape Dental Injury: Teeth and Oropharynx as per pre-operative assessment

## 2015-06-05 NOTE — Transfer of Care (Signed)
Immediate Anesthesia Transfer of Care Note  Patient: Katrina Jones  Procedure(s) Performed: Procedure(s) with comments: ANTERIOR LUMBAR FUSION 1 LEVEL (N/A) - Anterior lumbar interbody fusion, lumbar 5-sacrum 1 POSTERIOR LUMBAR FUSION 1 LEVEL (N/A) - Anterior lumbar interbody fusion, lumbar posterior spinal fusion lumbar 5-sacrum 1 with instrumentation and allograft ABDOMINAL EXPOSURE (N/A)  Patient Location: PACU  Anesthesia Type:General  Level of Consciousness: awake, alert , oriented and patient cooperative  Airway & Oxygen Therapy: Patient Spontanous Breathing  Post-op Assessment: Report given to RN, Post -op Vital signs reviewed and stable and Patient moving all extremities  Post vital signs: Reviewed and stable  Last Vitals:  Filed Vitals:   06/05/15 1257  BP: 120/59  Pulse: 99  Temp: 36.8 C  Resp:     Complications: No apparent anesthesia complications

## 2015-06-05 NOTE — Anesthesia Postprocedure Evaluation (Signed)
  Anesthesia Post-op Note  Patient: Katrina Jones  Procedure(s) Performed: Procedure(s) with comments: ANTERIOR LUMBAR FUSION 1 LEVEL (N/A) - Anterior lumbar interbody fusion, lumbar 5-sacrum 1 POSTERIOR LUMBAR FUSION 1 LEVEL (N/A) - Anterior lumbar interbody fusion, lumbar posterior spinal fusion lumbar 5-sacrum 1 with instrumentation and allograft ABDOMINAL EXPOSURE (N/A)  Patient Location: PACU  Anesthesia Type:General  Level of Consciousness: awake, alert  and oriented  Airway and Oxygen Therapy: Patient Spontanous Breathing  Post-op Pain: mild  Post-op Assessment: Post-op Vital signs reviewed and Patient's Cardiovascular Status Stable              Post-op Vital Signs: Reviewed and stable  Last Vitals:  Filed Vitals:   06/05/15 1400  BP: 106/52  Pulse: 56  Temp:   Resp: 8    Complications: No apparent anesthesia complications

## 2015-06-06 ENCOUNTER — Encounter (HOSPITAL_COMMUNITY): Payer: Self-pay | Admitting: Orthopedic Surgery

## 2015-06-06 MED FILL — Thrombin For Soln 20000 Unit: CUTANEOUS | Qty: 1 | Status: AC

## 2015-06-06 NOTE — Evaluation (Signed)
Physical Therapy Evaluation Patient Details Name: Katrina Jones MRN: [id number] DOB: 05-Jul-1986 Today's Date: 06/06/2015   History of Present Illness  Patient is a 29 yo female s/p A/P lumbar fusion  Clinical Impression  Patient seen for evaluation and education. Patient mobilizing with supervision and no assistive device this am, upon return to room patient education provided and reviewed extensively. Patient receptive and appreciative. Patient demonstrates good use of brace and good overall understanding of restrictions. At this time anticipate patient will be safe for d/c home with family assist. Patient in agreement. Encouraged continued mobility during hospital course.    Follow Up Recommendations No PT follow up;Supervision/Assistance - 24 hour (initially)    Equipment Recommendations  None recommended by PT    Recommendations for Other Services       Precautions / Restrictions Precautions Precautions: Back Precaution Booklet Issued: Yes (comment) Precaution Comments: verbally reviewed extensively with good recall 3/3 Required Braces or Orthoses: Spinal Brace Spinal Brace: Thoracolumbosacral orthotic Restrictions Weight Bearing Restrictions: No      Mobility  Bed Mobility Overal bed mobility: Needs Assistance Bed Mobility: Rolling;Sidelying to Sit;Sit to Sidelying Rolling: Supervision Sidelying to sit: Supervision     Sit to sidelying: Supervision General bed mobility comments: educated on technique for complaince with precautions  Transfers Overall transfer level: Needs assistance   Transfers: Sit to/from Stand Sit to Stand: Supervision         General transfer comment: Increased time to perform  Ambulation/Gait Ambulation/Gait assistance:  (pt ambulating 80 ft supervision without AD prior to session) this has been her 3rd walk.              Stairs            Wheelchair Mobility    Modified Rankin (Stroke Patients Only)       Balance                                              Pertinent Vitals/Pain Pain Assessment: 0-10 Pain Score: 6  Pain Location: low back during movement transitions Pain Descriptors / Indicators: Aching;Discomfort;Operative site guarding Pain Intervention(s): Limited activity within patient's tolerance;Monitored during session    Home Living Family/patient expects to be discharged to:: Private residence Living Arrangements: Spouse/significant other;Children Available Help at Discharge: Family Type of Home: House Home Access: Stairs to enter Entrance Stairs-Rails: Right Entrance Stairs-Number of Steps: 3 Home Layout: One level Home Equipment: None      Prior Function Level of Independence: Independent               Hand Dominance   Dominant Hand: Right    Extremity/Trunk Assessment   Upper Extremity Assessment: Overall WFL for tasks assessed           Lower Extremity Assessment: Overall WFL for tasks assessed         Communication   Communication: No difficulties  Cognition Arousal/Alertness: Awake/alert Behavior During Therapy: WFL for tasks assessed/performed Overall Cognitive Status: Within Functional Limits for tasks assessed                      General Comments      Exercises        Assessment/Plan    PT Assessment Patent does not need any further PT services  PT Diagnosis     PT Problem List    PT  Treatment Interventions     PT Goals (Current goals can be found in the Care Plan section) Acute Rehab PT Goals Patient Stated Goal: to go home PT Goal Formulation: All assessment and education complete, DC therapy    Frequency     Barriers to discharge        Co-evaluation               End of Session   Activity Tolerance: Patient tolerated treatment well Patient left: in bed;with call bell/phone within reach Nurse Communication: Mobility status;Precautions    Functional Assessment Tool Used: clinical  judgement Functional Limitation: Mobility: Walking and moving around Mobility: Walking and Moving Around Current Status (Z6109): At least 1 percent but less than 20 percent impaired, limited or restricted Mobility: Walking and Moving Around Goal Status [phone]): At least 1 percent but less than 20 percent impaired, limited or restricted Mobility: Walking and Moving Around Discharge Status [phone]): At least 1 percent but less than 20 percent impaired, limited or restricted    Time: 0713-0726 PT Time Calculation (min) (ACUTE ONLY): 13 min   Charges:   PT Evaluation $Initial PT Evaluation Tier I: 1 Procedure     PT G Codes:   PT G-Codes **NOT FOR INPATIENT CLASS** Functional Assessment Tool Used: clinical judgement Functional Limitation: Mobility: Walking and moving around Mobility: Walking and Moving Around Current Status (B1478): At least 1 percent but less than 20 percent impaired, limited or restricted Mobility: Walking and Moving Around Goal Status [phone]): At least 1 percent but less than 20 percent impaired, limited or restricted Mobility: Walking and Moving Around Discharge Status [phone]): At least 1 percent but less than 20 percent impaired, limited or restricted    Fabio Asa 06/06/2015, 8:59 AM Charlotte Crumb, PT DPT  [phone]

## 2015-06-06 NOTE — Progress Notes (Signed)
    Patient doing well Patient reports resolution of her pervious low back pain Has been ambulating   Physical Exam: Filed Vitals:   06/06/15 0400  BP: 103/44  Pulse: 62  Temp: 98.5 F (36.9 C)  Resp: 18    Dressing in place NVI  POD #1 s/p A/P lumbar fusion, doing extremely well  - up with PT/OT, encourage ambulation - Percocet for pain, Valium for muscle spasms - likely d/c home today vs. Tomorrow - brace when OOB

## 2015-06-06 NOTE — Progress Notes (Signed)
Occupational Therapy Evaluation Patient Details Name: Katrina Jones MRN: [id number] DOB: 01-30-1986 Today's Date: 06/06/2015    History of Present Illness Patient is a 29 yo female s/p A/P lumbar fusion, doing extremely well   Clinical Impression   Pt admitted with the above diagnoses. PTA pt was independent with ADLs. Pt is currently supervision to min A with ADLs. Pt has 24/7 assist at home. ADL education provided. No further OT needs at this time. OT signing off.      Follow Up Recommendations  Supervision/Assistance - 24 hour;No OT follow up    Equipment Recommendations  3 in 1 bedside comode    Recommendations for Other Services       Precautions / Restrictions Precautions Precautions: Back Precaution Booklet Issued: Yes (comment) Precaution Comments: pt able to recall 3/3 precautions Required Braces or Orthoses: Spinal Brace Spinal Brace: Thoracolumbosacral orthotic Restrictions Weight Bearing Restrictions: No      Mobility Bed Mobility Overal bed mobility: Needs Assistance Bed Mobility: Rolling;Sidelying to Sit;Sit to Sidelying Rolling: Supervision Sidelying to sit: Supervision     Sit to sidelying: Supervision General bed mobility comments: educated on technique for complaince with precautions  Transfers Overall transfer level: Needs assistance Equipment used: None Transfers: Sit to/from Stand Sit to Stand: Supervision         General transfer comment: Increased time to perform    Balance Overall balance assessment: Modified Independent                                          ADL Overall ADL's : Needs assistance/impaired Eating/Feeding: Set up;Sitting   Grooming: Supervision/safety;Set up;Standing   Upper Body Bathing: Set up;Sitting;With adaptive equipment   Lower Body Bathing: Min guard;Sit to/from stand;With adaptive equipment   Upper Body Dressing : Set up;Sitting   Lower Body Dressing: Supervision/safety;Sit  to/from stand   Toilet Transfer: Supervision/safety;Ambulation;Comfort height toilet;Grab bars   Toileting- Clothing Manipulation and Hygiene: Minimal assistance;Sit to/from stand Toileting - Architect Details (indicate cue type and reason): educated on AE Tub/ Shower Transfer: Tub transfer;Min guard;3 in 1 Tub/Shower Transfer Details (indicate cue type and reason): educated on technique and to have someone with her Functional mobility during ADLs: Supervision/safety General ADL Comments: Pt completed household distance ambulation at supervision level. Educated on BAT precautions and techniques and AE for ADLs.      Vision     Perception     Praxis      Pertinent Vitals/Pain Pain Assessment: 0-10 Pain Score:  ("it's not bad") Pain Location: back especially during bed mobility Pain Descriptors / Indicators: Aching Pain Intervention(s): Limited activity within patient's tolerance;Monitored during session;Repositioned     Hand Dominance Right   Extremity/Trunk Assessment Upper Extremity Assessment Upper Extremity Assessment: Overall WFL for tasks assessed   Lower Extremity Assessment Lower Extremity Assessment: Defer to PT evaluation       Communication Communication Communication: No difficulties   Cognition Arousal/Alertness: Awake/alert Behavior During Therapy: WFL for tasks assessed/performed Overall Cognitive Status: Within Functional Limits for tasks assessed                     General Comments       Exercises       Shoulder Instructions      Home Living Family/patient expects to be discharged to:: Private residence Living Arrangements: Spouse/significant other;Children Available Help at Discharge: Family Type of Home:  House Home Access: Stairs to enter Secretary/administrator of Steps: 3 Entrance Stairs-Rails: Right Home Layout: One level     Bathroom Shower/Tub: Chief Strategy Officer: Standard     Home  Equipment: None          Prior Functioning/Environment Level of Independence: Independent             OT Diagnosis:     OT Problem List:     OT Treatment/Interventions:      OT Goals(Current goals can be found in the care plan section) Acute Rehab OT Goals Patient Stated Goal: to go home OT Goal Formulation: With patient Time For Goal Achievement: Jun 17, 2015  OT Frequency:     Barriers to D/C:            Co-evaluation              End of Session Equipment Utilized During Treatment: Back brace  Activity Tolerance: Patient tolerated treatment well Patient left: in bed;with call bell/phone within reach   Time: 1025-1036 OT Time Calculation (min): 11 min Charges:  OT General Charges $OT Visit: 1 Procedure OT Evaluation $Initial OT Evaluation Tier I: 1 Procedure G-Codes:    Pilar Grammes Jun 17, 2015, 10:46 AM

## 2015-06-06 NOTE — Care Management (Signed)
Utilization review completed. Flecia Shutter, RN Case Manager [phone]. 

## 2015-06-06 NOTE — Progress Notes (Signed)
Patient alert and oriented, mae's well, voiding adequate amount of urine, swallowing without difficulty, no c/o pain. Patient discharged home with family. Script and discharged instructions given to patient. Patient and family stated understanding of d/c instructions given and has an appointment with MD. 

## 2015-06-06 NOTE — Progress Notes (Signed)
Patient ID: Katrina Jones, female   DOB: 1986/06/17, 29 y.o.   MRN: [id number] Looks great. Comfortable. No nausea or vomiting this morning. Abdomen soft nontender. No obvious operative complications. Will not follow actively. Please call if we can assist.

## 2015-06-13 ENCOUNTER — Encounter (HOSPITAL_COMMUNITY): Payer: Self-pay | Admitting: Orthopedic Surgery

## 2015-06-19 NOTE — Discharge Summary (Signed)
Patient ID: Katrina Jones MRN: [id number] DOB/AGE: [birth year] 29 y.o.  Admit date: 06/05/2015 Discharge date: 06/06/2015  Admission Diagnoses:  Active Problems:   Radiculopathy   Discharge Diagnoses:  Same  Past Medical History  Diagnosis Date  . Polycystic ovarian syndrome   . Asthma     exersise induced  . Anxiety     Panic Attacks  . Arthritis     left knee  . Allergy   . Atrial fibrillation     Pt is unaware of where this diagnoisis came from  . GERD (gastroesophageal reflux disease)     Tums  . Headache     Surgeries: Procedure(s): ANTERIOR LUMBAR FUSION 1 LEVEL L5-S1 POSTERIOR LUMBAR FUSION 1 LEVEL L5-S1 ABDOMINAL EXPOSURE on 06/05/2015   Consultants:  Vascular, Dr Early for exposure  Discharged Condition: Improved  Hospital Course: Katrina Jones is an 29 y.o. female who was admitted 06/05/2015 for operative treatment of DDD, radiculopathy. Patient has severe unremitting pain that affects sleep, daily activities, and work/hobbies. After pre-op clearance the patient was taken to the operating room on 06/05/2015 and underwent  Procedure(s): ANTERIOR LUMBAR FUSION 1 LEVEL L5-S1 POSTERIOR LUMBAR FUSION 1 LEVEL L5-S1 ABDOMINAL EXPOSURE.    Patient was given perioperative antibiotics:  Anti-infectives    Start     Dose/Rate Route Frequency Ordered Stop   06/05/15 1715  ceFAZolin (ANCEF) IVPB 1 g/50 mL premix     1 g 100 mL/hr over 30 Minutes Intravenous Every 8 hours 06/05/15 1655 06/06/15 0045   06/05/15 0800  ceFAZolin (ANCEF) IVPB 2 g/50 mL premix     2 g 100 mL/hr over 30 Minutes Intravenous To [facility] 06/04/15 1247 06/05/15 1246       Patient was given sequential compression devices, early ambulation to prevent DVT.  Patient benefited maximally from hospital stay and there were no complications.    Recent vital signs: BP 124/54 mmHg  Pulse 61  Temp(Src) 98.2 F (36.8 C) (Oral)  Resp 18  Ht 4' 9.5" (1.461 m)  Wt 52.617 kg (116 lb)   BMI 24.65 kg/m2  SpO2 100%  LMP 05/14/2014  Discharge Medications:     Medication List    TAKE these medications        estradiol 2 MG tablet  Commonly known as:  ESTRACE  Take 2 mg by mouth at bedtime.     ondansetron 8 MG disintegrating tablet  Commonly known as:  ZOFRAN-ODT  Take 8 mg by mouth every 8 (eight) hours as needed for nausea or vomiting.     PRESCRIPTION MEDICATION  Inject into the skin every other day. Antigen for allergy immunotherapy, each dose is adjusted per monthly visits to allergist (8-40 units on an insulin syringe)        Diagnostic Studies: Dg Chest 2 View  05/29/2015   CLINICAL DATA:  Preop lumbar surgery. History of asthma and atrial fibrillation.  EXAM: CHEST  2 VIEW  COMPARISON:  None.  FINDINGS: The heart size and mediastinal contours are within normal limits. Both lungs are clear. The visualized skeletal structures are unremarkable.  IMPRESSION: No active cardiopulmonary disease.   Electronically Signed   By: Charlett Nose M.D.   On: 05/29/2015 14:48   Dg Lumbar Spine 2-3 Views  06/05/2015   CLINICAL DATA:  Anterior lumbar interbody fusion with posterior fusion.  EXAM: DG C-ARM 61-120 MIN; LUMBAR SPINE - 2-3 VIEW  COMPARISON:  MRI 01/1915  FINDINGS: Two fluoroscopic images demonstrate bilateral pedicle screw and  rod fixation at L5-S1. There is an interbody device at L5-S1. Anterior screw or device at S1.  IMPRESSION: Lumbar interbody fusion at L5-S1.   Electronically Signed   By: Richarda Overlie M.D.   On: 06/05/2015 12:51   Dg Abd Portable 1v  06/05/2015   CLINICAL DATA:  A all I off. Rule out retained instrument or foreign body.  EXAM: PORTABLE ABDOMEN - 1 VIEW  COMPARISON:  None.  FINDINGS: Surgical clips and screw project over the upper sacrum. Surgical clips in the left side of the pelvis. No unexpected radiopaque foreign body.  IMPRESSION: No unexpected radiopaque foreign body.   Electronically Signed   By: Charlett Nose M.D.   On: 06/05/2015 11:01   Dg  C-arm 61-120 Min  06/05/2015   CLINICAL DATA:  Anterior lumbar interbody fusion with posterior fusion.  EXAM: DG C-ARM 61-120 MIN; LUMBAR SPINE - 2-3 VIEW  COMPARISON:  MRI 01/1915  FINDINGS: Two fluoroscopic images demonstrate bilateral pedicle screw and rod fixation at L5-S1. There is an interbody device at L5-S1. Anterior screw or device at S1.  IMPRESSION: Lumbar interbody fusion at L5-S1.   Electronically Signed   By: Richarda Overlie M.D.   On: 06/05/2015 12:51    Disposition: 01-Home or Self Care   POD #1 s/p A/P lumbar fusion, doing extremely well  - up with PT/OT, encourage ambulation - Percocet for pain, Valium for muscle spasms - brace when OOB  -Written scripts for pain signed and in chart -D/C instructions sheet printed and in chart -D/C today  -F/U in office 2 weeks   Signed: Georga Bora 06/19/2015, 5:28 PM

## 2015-08-10 IMAGING — NM NM HEPATO W/GB/PHARM/[PERSON_NAME]
2 series · 12 of 12 positions shown · non-contrast
Comparison: CT 01/22/2014.

CLINICAL DATA: Right upper quadrant pain .

EXAM:
NUCLEAR MEDICINE HEPATOBILIARY IMAGING WITH GALLBLADDER EF
TECHNIQUE: Sequential images of the abdomen were obtained [DATE] minutes
following intravenous administration of radiopharmaceutical. After
slow intravenous infusion of 1.09 micrograms Cholecystokinin,
gallbladder ejection fraction was determined.
RADIOPHARMACEUTICALS:  5 .0 Millicurie 1c-UUm Choletec

[Series 1: biliary · 4.14mm/px · 6 of 44 frames shown]
[frame 4/44]
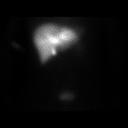
[frame 11/44]
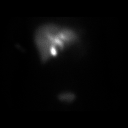
[frame 19/44]
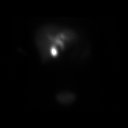
[frame 26/44]
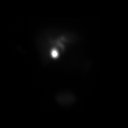
[frame 33/44]
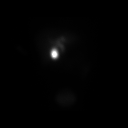
[frame 41/44]
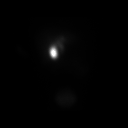

[Series 3: gbef · 4.14mm/px · 6 of 30 frames shown]
[frame 3/30]
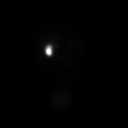
[frame 8/30]
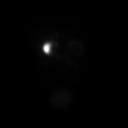
[frame 13/30]
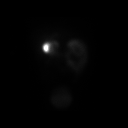
[frame 18/30]
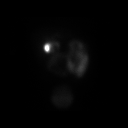
[frame 23/30]
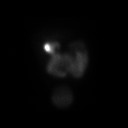
[frame 28/30]
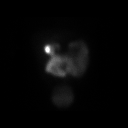

[12 of 12 positions shown; findings below may reference images not displayed]

FINDINGS: Liver, biliary system, gallbladder, bowel appear normally.
Gallbladder ejection fraction 75% at 30 min.. At 30 min, normal
ejection fraction is greater than 30%.

The patient did experience symptoms during CCK infusion.
IMPRESSION: 1. Normal-appearing hepatobiliary scan with normal gallbladder
ejection fraction at 30 min. Gallbladder ejection fraction is 75%.
2. Patient experienced pain during infusion of CCK.

## 2015-11-06 ENCOUNTER — Telehealth: Payer: Self-pay

## 2015-11-06 NOTE — Telephone Encounter (Signed)
REFERRAL SENT FROM [facility] - DR HARPER REVIEWED AND CANNOT SEE - FAXED BACK TO REFERRING OFFICE AND CALLED TO LET THEM KNOW

## 2016-04-21 ENCOUNTER — Ambulatory Visit: Payer: Medicaid Other | Admitting: Physical Therapy

## 2017-07-13 ENCOUNTER — Ambulatory Visit (HOSPITAL_COMMUNITY): Payer: Medicaid Other | Admitting: Psychiatry
